# Patient Record
Sex: Female | Born: 1995 | Race: Black or African American | Hispanic: No | Marital: Single | Smoking: Never smoker
Health system: Southern US, Community
[De-identification: ages and names within clinical notes are randomized; demographics above are authoritative.]

## PROBLEM LIST (undated history)

## (undated) ENCOUNTER — Inpatient Hospital Stay (HOSPITAL_COMMUNITY): Payer: Self-pay

## (undated) DIAGNOSIS — L209 Atopic dermatitis, unspecified: Secondary | ICD-10-CM

## (undated) DIAGNOSIS — L709 Acne, unspecified: Secondary | ICD-10-CM

## (undated) DIAGNOSIS — E282 Polycystic ovarian syndrome: Secondary | ICD-10-CM

## (undated) HISTORY — DX: Atopic dermatitis, unspecified: L20.9

## (undated) HISTORY — PX: NO PAST SURGERIES: SHX2092

## (undated) HISTORY — DX: Acne, unspecified: L70.9

---
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
## 2014-02-04 ENCOUNTER — Emergency Department (HOSPITAL_COMMUNITY)
Admission: EM | Admit: 2014-02-04 | Discharge: 2014-02-04 | Disposition: A | Discharge status: Home / Self Care | Payer: 59 | Attending: Emergency Medicine | Admitting: Emergency Medicine

## 2014-02-04 ENCOUNTER — Encounter (HOSPITAL_COMMUNITY): Payer: Self-pay | Admitting: Emergency Medicine

## 2014-02-04 DIAGNOSIS — Z872 Personal history of diseases of the skin and subcutaneous tissue: Secondary | ICD-10-CM | POA: Insufficient documentation

## 2014-02-04 DIAGNOSIS — E739 Lactose intolerance, unspecified: Secondary | ICD-10-CM | POA: Insufficient documentation

## 2014-02-04 DIAGNOSIS — R1084 Generalized abdominal pain: Secondary | ICD-10-CM | POA: Diagnosis present

## 2014-02-04 NOTE — ED Notes (Signed)
Pt states she thinks shes lactose intolerant, ate some cheese dip and ever since then has had abdominal cramping.

## 2014-02-04 NOTE — ED Notes (Signed)
Pt c/o mid abd pain and loose stool starting last night

## 2014-02-04 NOTE — Discharge Instructions (Signed)
Lactose Intolerance, Adult °Lactose intolerance is when the body is not able to digest lactose, a sugar found in milk and milk products. Lactose intolerance is caused by your body not producing enough of the enzyme lactase. When there is not enough lactase to digest the amount of lactose consumed, discomfort may be felt. Lactose intolerance is not a milk allergy. °For most people, lactase deficiency is a condition that develops naturally over time. After about the age of 2, the body begins to produce less lactase. But many people may not experience symptoms until they are much older. °CAUSES °Things that can cause you to be lactose intolerant include: °· Aging. °· Being born without the ability to make lactase. °· Certain digestive diseases. °· Injuries to the small intestine. °SYMPTOMS  °· Feeling sick to your stomach (nauseous). °· Diarrhea. °· Cramps. °· Bloating. °· Gas. °Symptoms usually show up a half hour or 2 hours after eating or drinking products containing lactose. °TREATMENT  °No treatment can improve the body's ability to produce lactase. However, symptoms can be controlled through diet. A medicine may be given to you to take when you consume lactose-containing foods or drinks. The medicine contains the lactase enzyme, which help the body digest lactose better. °HOME CARE INSTRUCTIONS °· Eat or drink dairy products as told by your caregiver or dietician. °· Take all medicine as directed by your caregiver. °· Find lactose-free or lactose-reduced products at your local grocery store. °· Talk to your caregiver or dietician to decide if you need any dietary supplements. °The following is the amount of calcium needed from the diet: °· 19 to 50 years: 1000 mg °· Over 50 years: 1200 mg °Calcium and Lactose in Common Foods °Non-Dairy Products / Calcium Content (mg) °· Calcium-fortified orange juice, 1 cup / 308 to 344 mg °· Sardines, with edible bones, 3 oz / 270 mg °· Salmon, canned, with edible bones, 3 oz /  205 mg °· Soymilk, fortified, 1 cup / 200 mg °· Broccoli (raw), 1 cup / 90 mg °· Orange, 1 medium / 50 mg °· Pinto beans, ½ cup / 40 mg °· Tuna, canned, 3 oz / 10 mg °· Lettuce greens, ½ cup / 10 mg °Dairy Products / Calcium Content (mg) / Lactose Content (g) °· Yogurt, plain, low-fat, 1 cup / 415 mg / 5 g °· Milk, reduced fat, 1 cup / 295 mg / 11 g °· Swiss cheese, 1 oz / 270 mg / 1 g °· Ice cream, ½ cup / 85 mg / 6 g °· Cottage cheese, ½ cup / 75 mg / 2 to 3 g °SEEK MEDICAL CARE IF: °You have no relief from your symptoms. °Document Released: 01/19/2005 Document Revised: 04/13/2011 Document Reviewed: 04/21/2013 °ExitCare® Patient Information ©2015 [employer], LLC. This information is not intended to replace advice given to you by your health care provider. Make sure you discuss any questions you have with your health care provider. ° °

## 2014-02-04 NOTE — ED Provider Notes (Signed)
CSN: [id number]     Arrival date & time 02/04/14  1108 History   First MD Initiated Contact with Patient 02/04/14 1200     Chief Complaint  Patient presents with  . Abdominal Pain     (Consider location/radiation/quality/duration/timing/severity/associated sxs/prior Treatment) Patient is a 19 y.o. female presenting with abdominal pain. The history is provided by the patient.  Abdominal Pain Pain location:  Generalized Pain quality: cramping   Pain severity:  Mild Onset quality:  Sudden Timing:  Intermittent Progression:  Unchanged Chronicity:  Chronic Context: suspicious food intake (when eating dairy products)   Relieved by:  Bowel activity Exacerbated by: dairy products. Associated symptoms: no chest pain, no chills, no cough, no fever and no shortness of breath     Past Medical History  Diagnosis Date  . Acne   . Atopic dermatitis    History reviewed. No pertinent past surgical history. History reviewed. No pertinent family history. History  Substance Use Topics  . Smoking status: Never Smoker   . Smokeless tobacco: Never Used  . Alcohol Use: No   OB History    No data available     Review of Systems  Constitutional: Negative for fever and chills.  Respiratory: Negative for cough and shortness of breath.   Cardiovascular: Negative for chest pain.  Gastrointestinal: Positive for abdominal pain.  All other systems reviewed and are negative.     Allergies  Review of patient's allergies indicates no known allergies.  Home Medications   Prior to Admission medications   Medication Sig Start Date End Date Taking? Authorizing Provider  azithromycin (ZITHROMAX) 500 MG tablet Take 2 tablets (1,000 mg total) by mouth once. Patient not taking: Reported on 02/04/2014 12/13/13   Janit Pagan, MD  fluconazole (DIFLUCAN) 150 MG tablet Take 1 tablet (150 mg total) by mouth once. Patient not taking: Reported on 02/04/2014 12/12/13   Janit Pagan, MD  metroNIDAZOLE  (FLAGYL) 500 MG tablet Take 1 tablet (500 mg total) by mouth 2 (two) times daily. Patient not taking: Reported on 02/04/2014 12/12/13   Janit Pagan, MD   BP 120/84 mmHg  Pulse 81  Temp(Src) 98.4 F (36.9 C) (Oral)  Resp 18  SpO2 100% Physical Exam  Constitutional: She is oriented to person, place, and time. She appears well-developed and well-nourished. No distress.  HENT:  Head: Normocephalic and atraumatic.  Mouth/Throat: Oropharynx is clear and moist.  Eyes: EOM are normal. Pupils are equal, round, and reactive to light.  Neck: Normal range of motion. Neck supple.  Cardiovascular: Normal rate and regular rhythm.  Exam reveals no friction rub.   No murmur heard. Pulmonary/Chest: Effort normal and breath sounds normal. No respiratory distress. She has no wheezes. She has no rales.  Abdominal: Soft. She exhibits no distension. There is no tenderness. There is no rebound.  Musculoskeletal: Normal range of motion. She exhibits no edema.  Neurological: She is alert and oriented to person, place, and time.  Skin: No rash noted. She is not diaphoretic.  Nursing note and vitals reviewed.   ED Course  Procedures (including critical care time) Labs Review Labs Reviewed - No data to display  Imaging Review No results found.   EKG Interpretation None      MDM   Final diagnoses:  Lactose intolerance    19 year old female here with diarrhea. Chronic, states it happens every time she eats dairy products. She said states "I think, lactose intolerant". She states last night after she had a milkshake she had used  the bathroom immediately after drinking it. I agree with her being lactose intolerant. Denies any fevers, vomiting. Instructed to use Lactaid and follow-up with PCP.    Elwin Mocha, MD 02/04/14 1700

## 2014-02-27 ENCOUNTER — Ambulatory Visit: Payer: Self-pay

## 2014-04-11 ENCOUNTER — Ambulatory Visit (INDEPENDENT_AMBULATORY_CARE_PROVIDER_SITE_OTHER): Payer: 59 | Admitting: Family Medicine

## 2014-04-11 ENCOUNTER — Other Ambulatory Visit (HOSPITAL_COMMUNITY)
Admission: RE | Admit: 2014-04-11 | Discharge: 2014-04-11 | Disposition: A | Discharge status: Home / Self Care | Payer: 59 | Source: Ambulatory Visit | Attending: Family Medicine | Admitting: Family Medicine

## 2014-04-11 ENCOUNTER — Encounter: Payer: Self-pay | Admitting: Family Medicine

## 2014-04-11 VITALS — BP 131/89 | HR 89 | Temp 98.6°F | Ht 65.0 in | Wt 207.7 lb

## 2014-04-11 DIAGNOSIS — Z3009 Encounter for other general counseling and advice on contraception: Secondary | ICD-10-CM

## 2014-04-11 DIAGNOSIS — Z309 Encounter for contraceptive management, unspecified: Secondary | ICD-10-CM

## 2014-04-11 DIAGNOSIS — N898 Other specified noninflammatory disorders of vagina: Secondary | ICD-10-CM

## 2014-04-11 DIAGNOSIS — Z308 Encounter for other contraceptive management: Secondary | ICD-10-CM

## 2014-04-11 DIAGNOSIS — Z113 Encounter for screening for infections with a predominantly sexual mode of transmission: Secondary | ICD-10-CM | POA: Diagnosis present

## 2014-04-11 DIAGNOSIS — N926 Irregular menstruation, unspecified: Secondary | ICD-10-CM | POA: Insufficient documentation

## 2014-04-11 LAB — POCT WET PREP (WET MOUNT): Clue Cells Wet Prep Whiff POC: NEGATIVE

## 2014-04-11 LAB — POCT URINE PREGNANCY: Preg Test, Ur: NEGATIVE

## 2014-04-11 MED ORDER — MEDROXYPROGESTERONE ACETATE 150 MG/ML IM SUSP
150.0000 mg | Freq: Once | INTRAMUSCULAR | Status: AC
  Administered 2014-04-11: 150 mg via INTRAMUSCULAR

## 2014-04-11 MED ORDER — METRONIDAZOLE 500 MG PO TABS: 500.0000 mg | ORAL_TABLET | Freq: Three times a day (TID) | ORAL | Status: DC

## 2014-04-11 NOTE — Progress Notes (Signed)
   Subjective:    Patient ID: Kayla Mckay, female    DOB: [birth year], 19 y.o.   MRN: [id number]  HPI  Irregular menstrual cycles: Patient presents with a history of varicella menstrual cycles after missing her Depo-Provera shot. She states she went without her Depo-Provera for a few months, and had a shot in November. She noticed at the end of January she was spotting occasionally. She missed the timing of her shot in February. She then noticed after intercourse last week that she started bleeding. She denies unprotected sex over the last few months. She is interested in a different form of birth control if possible, secondary to almost a 20 pound weight gain which she feels is contributed to her Depo-Provera shot..  Vaginal discharge: Patient was seen in November, and diagnosed with bacterial vaginitis, she states she only took 2 days of her Flagyl. She was also chlamydia positive and treated at that time. She reports that she has had gonorrhea in the past. She doesn't understand why she continues to have repeat infections, and is concerned that they are not getting cured. She has a different partner currently than she did in November. She reports that her partner told her that he was treated as well. States she took all of her antibiotics that was prescribed as prescribed except for the Flagyl for her BV. She has noticed an increase in discharge over the last week, with return of a foul odor. States she has been sexually active, but has not had unprotected sex. She denies fever, abdominal pain, vaginal itching or dysuria.  Nonsmoker Past Medical History  Diagnosis Date  . Acne   . Atopic dermatitis    No Known Allergies   Review of Systems Per hPI    Objective:   Physical Exam BP 131/89 mmHg  Pulse 89  Temp(Src) 98.6 F (37 C) (Oral)  Ht 5\' 5"  (1.651 m)  Wt 207 lb 11.2 oz (94.212 kg)  BMI 34.56 kg/m2 Gen: NAD. Well developed, well nourished, obese. Nontoxic in appearance. Abd:  Soft. Round. NTND. BS present. No Masses palpated.  GYN:  External genitalia within normal limits, mild erythema.  Vaginal mucosa pink, moist, normal rugae.  Nonfriable cervix without lesions, minimal yellow discharge, no bleeding noted on speculum exam.   No cervical motion tenderness. No adnexal masses bilaterally.        Assessment & Plan:

## 2014-04-11 NOTE — Assessment & Plan Note (Signed)
Patient was counseled on birth control options for her today. She decided to go ahead and get the Depo-Provera shot today after negative pregnancy test. Over the pamphlet that was sent home with her, and make a decision before her next Depo-Provera shot is due. He was encouraged to call in ahead of time if she decided she wanted an implantable device an IUD to see if there is a charge with her insurance and also in order to have us get one in her under her name.

## 2014-04-11 NOTE — Patient Instructions (Signed)
Bacterial Vaginosis Bacterial vaginosis is a vaginal infection that occurs when the normal balance of bacteria in the vagina is disrupted. It results from an overgrowth of certain bacteria. This is the most common vaginal infection in women of childbearing age. Treatment is important to prevent complications, especially in pregnant women, as it can cause a premature delivery. CAUSES  Bacterial vaginosis is caused by an increase in harmful bacteria that are normally present in smaller amounts in the vagina. Several different kinds of bacteria can cause bacterial vaginosis. However, the reason that the condition develops is not fully understood. RISK FACTORS Certain activities or behaviors can put you at an increased risk of developing bacterial vaginosis, including:  Having a new sex partner or multiple sex partners.  Douching.  Using an intrauterine device (IUD) for contraception. Women do not get bacterial vaginosis from toilet seats, bedding, swimming pools, or contact with objects around them. SIGNS AND SYMPTOMS  Some women with bacterial vaginosis have no signs or symptoms. Common symptoms include:  Grey vaginal discharge.  A fishlike odor with discharge, especially after sexual intercourse.  Itching or burning of the vagina and vulva.  Burning or pain with urination. DIAGNOSIS  Your health care provider will take a medical history and examine the vagina for signs of bacterial vaginosis. A sample of vaginal fluid may be taken. Your health care provider will look at this sample under a microscope to check for bacteria and abnormal cells. A vaginal pH test may also be done.  TREATMENT  Bacterial vaginosis may be treated with antibiotic medicines. These may be given in the form of a pill or a vaginal cream. A second round of antibiotics may be prescribed if the condition comes back after treatment.  HOME CARE INSTRUCTIONS   Only take over-the-counter or prescription medicines as  directed by your health care provider.  If antibiotic medicine was prescribed, take it as directed. Make sure you finish it even if you start to feel better.  Do not have sex until treatment is completed.  Tell all sexual partners that you have a vaginal infection. They should see their health care provider and be treated if they have problems, such as a mild rash or itching.  Practice safe sex by using condoms and only having one sex partner. SEEK MEDICAL CARE IF:   Your symptoms are not improving after 3 days of treatment.  You have increased discharge or pain.  You have a fever. MAKE SURE YOU:   Understand these instructions.  Will watch your condition.  Will get help right away if you are not doing well or get worse. FOR MORE INFORMATION  Centers for Disease Control and Prevention, Division of STD Prevention: [employer] American Sexual Health Association (ASHA): www.ashastd.org  Document Released: 01/19/2005 Document Revised: 11/09/2012 Document Reviewed: 08/31/2012 [facility] Patient Information 2015 [place], [place]. This information is not intended to replace advice given to you by your health care provider. Make sure you discuss any questions you have with your health care provider.  You still have a bacterial vaginosis infection, please take your Flagyl as prescribed, I have be taking it 3 times a day, you can take this with meals. I will call you once her other cultures become available. Please look over the birth control options pamphlet that was given to you today, in make a decision about what you're interested in using. Extremities you call and had a time at least a few weeks to let us know what you decided so that we  may order one for you if it is an implantable device or the IUD. May be some charged you out-of-pocket, and insurance may not cover all of it so make sure that you speak to us prior so we can let you know if there is a charge.

## 2014-04-11 NOTE — Assessment & Plan Note (Signed)
Likely secondary to timing of Depo-Provera. Patient's pregnancy test today was negative. Depo-Provera shot was given today. Follow-up as needed

## 2014-04-11 NOTE — Assessment & Plan Note (Addendum)
Patient positive for clue cells on wet prep today. Treated with Flagyl increase dose to 3 times a day. Cultures for gonorrhea and chlamydia. She'll be called with results once they become available. And treated appropriately if necessary. She was counseled on safe sex practices.

## 2014-04-12 ENCOUNTER — Telehealth: Payer: Self-pay | Admitting: Family Medicine

## 2014-04-12 LAB — CERVICOVAGINAL ANCILLARY ONLY
Chlamydia: NEGATIVE
Neisseria Gonorrhea: NEGATIVE

## 2014-04-12 NOTE — Telephone Encounter (Signed)
LVM for patient to call back to inform her of below results. 

## 2014-04-12 NOTE — Telephone Encounter (Signed)
Please call pt, her gonorrhea and Chlamydia test are negative. Thanks.

## 2014-04-13 NOTE — Telephone Encounter (Signed)
Pt called back and I informed her of the below message referencing her G&C results. Kayla Mckay, [name] D

## 2014-05-24 ENCOUNTER — Encounter: Payer: Self-pay | Admitting: Family Medicine

## 2014-05-24 ENCOUNTER — Ambulatory Visit (INDEPENDENT_AMBULATORY_CARE_PROVIDER_SITE_OTHER): Payer: 59 | Admitting: Family Medicine

## 2014-05-24 VITALS — BP 128/82 | HR 99 | Temp 98.3°F | Ht 65.0 in | Wt 206.3 lb

## 2014-05-24 DIAGNOSIS — R319 Hematuria, unspecified: Secondary | ICD-10-CM

## 2014-05-24 DIAGNOSIS — N3001 Acute cystitis with hematuria: Secondary | ICD-10-CM | POA: Diagnosis not present

## 2014-05-24 LAB — POCT URINALYSIS DIPSTICK
Bilirubin, UA: NEGATIVE
Glucose, UA: NEGATIVE
Ketones, UA: NEGATIVE
Nitrite, UA: NEGATIVE
PROTEIN UA: 100
Spec Grav, UA: 1.02
UROBILINOGEN UA: 0.2
pH, UA: 6

## 2014-05-24 LAB — POCT UA - MICROSCOPIC ONLY: RBC, urine, microscopic: >20

## 2014-05-24 MED ORDER — CEPHALEXIN 500 MG PO CAPS: 500.0000 mg | ORAL_CAPSULE | Freq: Three times a day (TID) | ORAL | Status: DC

## 2014-05-24 NOTE — Patient Instructions (Signed)
Thank you for coming in, today!  I do think you have a UTI. I want you to take Keflex (cephalexin) 500 mg three times a day for 5 days. We will check a urine culture. If anything grows that looks like the Keflex won't cover it, or if we need to change your antibiotics, we'll call you. Otherwise, come back to see Dr. Doroteo GlassmanPhelps as you need.  Please feel free to call with any questions or concerns at any time, at [phone]. --Dr. Casper HarrisonStreet

## 2014-05-24 NOTE — Progress Notes (Signed)
   Subjective:    Patient ID: Kayla Mckay, female    DOB: [birth year], 19 y.o.   MRN: [id number]  HPI: Pt presents to clinic for SDA visit for blood in her urine and a "crampy" feeling in her bladder when she urinates. She noted blood in her urine about 4 days ago, a tinge of blood on the tissue when she wipes. She does feel like she has incomplete emptying and increased frequency / urge, with some dribbling. She has no vaginal discharge, itching, or burning. Denies fevers, chills, N/V, abdominal pain, change in her bowel habits.  Of note pt was recently treated for BV with Flagyl and feels like these symptoms started after taking that. She has had UTI's in the past (last was about 6 months ago), and her current symptoms are very similar to that. She is on Depo-Provera and has no regular periods but has no obvious vaginal bleeding / spotting, currently.  Review of Systems: As above.      Objective:   Physical Exam BP 128/82 mmHg  Pulse 99  Temp(Src) 98.3 F (36.8 C) (Oral)  Ht 5\' 5"  (1.651 m)  Wt 206 lb 4.8 oz (93.577 kg)  BMI 34.33 kg/m2 Gen: non-toxic-appearing young adult female in NAD HEENT: [place]/AT, EOMI, PERRLA, MMM Cardio: RRR, no murmur appreciated Pulm: CTAB, no wheezes, normal WOB Abd: soft, BS+, mild suprapubic tenderness with deep palpation Ext: warm, well-perfused, no LE edema  UA: cloudy, 100 protein, large leukocytes, large Hb Urine micro: loaded WBC, >20 RBC pHPF, 2+ bacteria, few epithelial cells     Assessment & Plan:  19yo with clinical symptoms of acute cystitis with consistent exam and UA / urine microscopy consistent with UTI with hematuria - Rx for Keflex 500 mg TID for 5 days - urine culture collected; will f/u result as needed and change abx if warranted - recommended good PO hydration, OTC meds for pain as needed - strongly recommended close f/u if any frank worsening, fever, inability to tolerate PO, etc - f/u with PCP Dr. Doroteo GlassmanPhelps otherwise, as  needed  Note FYI to Dr. Doroteo GlassmanPhelps.  Bobbye Mortonhristopher M Maeola Mchaney, MD PGY-3, [facility] 05/24/2014, 12:31 PM

## 2014-05-26 LAB — URINE CULTURE: Colony Count: >=100000

## 2014-05-31 ENCOUNTER — Encounter (HOSPITAL_COMMUNITY): Payer: Self-pay

## 2014-05-31 ENCOUNTER — Emergency Department (HOSPITAL_COMMUNITY)
Admission: EM | Admit: 2014-05-31 | Discharge: 2014-05-31 | Disposition: A | Discharge status: Home / Self Care | Payer: 59 | Attending: Emergency Medicine | Admitting: Emergency Medicine

## 2014-05-31 DIAGNOSIS — N390 Urinary tract infection, site not specified: Secondary | ICD-10-CM | POA: Insufficient documentation

## 2014-05-31 DIAGNOSIS — R197 Diarrhea, unspecified: Secondary | ICD-10-CM | POA: Insufficient documentation

## 2014-05-31 DIAGNOSIS — Z872 Personal history of diseases of the skin and subcutaneous tissue: Secondary | ICD-10-CM | POA: Insufficient documentation

## 2014-05-31 DIAGNOSIS — Z3202 Encounter for pregnancy test, result negative: Secondary | ICD-10-CM | POA: Diagnosis not present

## 2014-05-31 LAB — CBC WITH DIFFERENTIAL/PLATELET
BASOS PCT: 0 % (ref 0–1)
Basophils Absolute: 0 10*3/uL (ref 0.0–0.1)
EOS ABS: 0.2 10*3/uL (ref 0.0–0.7)
Eosinophils Relative: 2 % (ref 0–5)
HCT: 40.2 % (ref 36.0–46.0)
HEMOGLOBIN: 12.6 g/dL (ref 12.0–15.0)
LYMPHS ABS: 2.4 10*3/uL (ref 0.7–4.0)
Lymphocytes Relative: 27 % (ref 12–46)
MCH: 26.4 pg (ref 26.0–34.0)
MCHC: 31.3 g/dL (ref 30.0–36.0)
MCV: 84.1 fL (ref 78.0–100.0)
MONOS PCT: 5 % (ref 3–12)
Monocytes Absolute: 0.5 10*3/uL (ref 0.1–1.0)
NEUTROS ABS: 6 10*3/uL (ref 1.7–7.7)
NEUTROS PCT: 66 % (ref 43–77)
PLATELETS: 247 10*3/uL (ref 150–400)
RBC: 4.78 MIL/uL (ref 3.87–5.11)
RDW: 13.7 % (ref 11.5–15.5)
WBC: 9.1 10*3/uL (ref 4.0–10.5)

## 2014-05-31 LAB — COMPREHENSIVE METABOLIC PANEL
ALK PHOS: 70 U/L (ref 39–117)
ALT: 15 U/L (ref 0–35)
AST: 26 U/L (ref 0–37)
Albumin: 3.9 g/dL (ref 3.5–5.2)
Anion gap: 7 (ref 5–15)
BUN: 7 mg/dL (ref 6–23)
CHLORIDE: 108 mmol/L (ref 96–112)
CO2: 25 mmol/L (ref 19–32)
Calcium: 9 mg/dL (ref 8.4–10.5)
Creatinine, Ser: 0.87 mg/dL (ref 0.50–1.10)
GFR calc Af Amer: >90 mL/min (ref >90)
GFR calc non Af Amer: >90 mL/min (ref >90)
Glucose, Bld: 93 mg/dL (ref 70–99)
Potassium: 3.3 mmol/L — ABNORMAL LOW (ref 3.5–5.1)
SODIUM: 140 mmol/L (ref 135–145)
Total Bilirubin: 0.8 mg/dL (ref 0.3–1.2)
Total Protein: 7.7 g/dL (ref 6.0–8.3)

## 2014-05-31 LAB — URINALYSIS, ROUTINE W REFLEX MICROSCOPIC
Bilirubin Urine: NEGATIVE
Glucose, UA: NEGATIVE mg/dL
Ketones, ur: NEGATIVE mg/dL
Nitrite: NEGATIVE
Protein, ur: NEGATIVE mg/dL
Specific Gravity, Urine: 1.014 (ref 1.005–1.030)
Urobilinogen, UA: 0.2 mg/dL (ref 0.0–1.0)
pH: 6 (ref 5.0–8.0)

## 2014-05-31 LAB — URINE MICROSCOPIC-ADD ON

## 2014-05-31 LAB — PREGNANCY, URINE: Preg Test, Ur: NEGATIVE

## 2014-05-31 MED ORDER — ACIDOPHILUS PROBIOTIC 10 MG PO TABS: 10.0000 mg | ORAL_TABLET | Freq: Two times a day (BID) | ORAL | Status: DC

## 2014-05-31 NOTE — ED Provider Notes (Signed)
CSN: [id number]     Arrival date & time 05/31/14  1719 History   First MD Initiated Contact with Patient 05/31/14 1827     Chief Complaint  Patient presents with  . Emesis  . Diarrhea     (Consider location/radiation/quality/duration/timing/severity/associated sxs/prior Treatment) HPI Comments: Patient was diagnosed with the urine tract infection approximately 1 week ago and was given a prescription for Keflex. She did not fill the prescription until 3 days ago and took the first dose yesterday. She has had dysuria, frequency and suprapubic tenderness. She denies any fever but after starting the antibiotic yesterday she had 2 episodes of mucousy stool and has been nauseated today. She denies any blood in the stool and no vomiting. No flank tenderness. She did take a course of Flagyl 2 weeks ago for bacterial vaginosis but denies any symptoms after taking that.  Patient is a 19 y.o. female presenting with diarrhea. The history is provided by the patient.  Diarrhea Quality:  Mucous Severity:  Mild Onset quality:  Sudden Number of episodes:  2 Duration:  1 day Timing:  Sporadic Progression:  Unchanged Relieved by:  None tried Worsened by:  Nothing tried Ineffective treatments:  None tried Associated symptoms: no chills, no recent cough, no fever and no vomiting     Past Medical History  Diagnosis Date  . Acne   . Atopic dermatitis    History reviewed. No pertinent past surgical history. History reviewed. No pertinent family history. History  Substance Use Topics  . Smoking status: Never Smoker   . Smokeless tobacco: Never Used  . Alcohol Use: No   OB History    No data available     Review of Systems  Constitutional: Negative for fever and chills.  Gastrointestinal: Positive for diarrhea. Negative for vomiting.  All other systems reviewed and are negative.     Allergies  Review of patient's allergies indicates no known allergies.  Home Medications   Prior to  Admission medications   Medication Sig Start Date End Date Taking? Authorizing Provider  cephALEXin (KEFLEX) 500 MG capsule Take 1 capsule (500 mg total) by mouth 3 (three) times daily. 05/24/14   Stephanie Couphristopher M Street, MD   BP 130/84 mmHg  Pulse 76  Temp(Src) 98.1 F (36.7 C) (Oral)  Resp 18  SpO2 100% Physical Exam  Constitutional: She is oriented to person, place, and time. She appears well-developed and well-nourished. No distress.  HENT:  Head: Normocephalic and atraumatic.  Mouth/Throat: Oropharynx is clear and moist.  Eyes: Conjunctivae and EOM are normal. Pupils are equal, round, and reactive to light.  Neck: Normal range of motion. Neck supple.  Cardiovascular: Normal rate, regular rhythm and intact distal pulses.   No murmur heard. Pulmonary/Chest: Effort normal and breath sounds normal. No respiratory distress. She has no wheezes. She has no rales.  Abdominal: Soft. She exhibits no distension. There is tenderness in the suprapubic area. There is no rebound and no guarding.  Musculoskeletal: Normal range of motion. She exhibits no edema or tenderness.  Neurological: She is alert and oriented to person, place, and time.  Skin: Skin is warm and dry. No rash noted. No erythema.  Psychiatric: She has a normal mood and affect. Her behavior is normal.  Nursing note and vitals reviewed.   ED Course  Procedures (including critical care time) Labs Review Labs Reviewed  COMPREHENSIVE METABOLIC PANEL - Abnormal; Notable for the following:    Potassium 3.3 (*)    All other components within normal  limits  URINALYSIS, ROUTINE W REFLEX MICROSCOPIC - Abnormal; Notable for the following:    APPearance CLOUDY (*)    Hgb urine dipstick SMALL (*)    Leukocytes, UA LARGE (*)    All other components within normal limits  URINE MICROSCOPIC-ADD ON - Abnormal; Notable for the following:    Bacteria, UA MANY (*)    All other components within normal limits  CBC WITH DIFFERENTIAL/PLATELET   PREGNANCY, URINE    Imaging Review No results found.   EKG Interpretation None      MDM   Final diagnoses:  UTI (lower urinary tract infection)  Diarrhea    Patient presents today due to 2 episodes of mucousy diarrhea that occurred after she started Keflex yesterday. She has had nausea without vomiting today and denies any back pain. She was diagnosed with the UTI a proximal line 1-2 weeks ago but did not have the antibiotic filled until 3 days ago and did not start it until yesterday. Patient states she often gets diarrhea when taking antibiotics but never noticed mucus before. She denies any travel, only other antibiotic she's been on his Flagyl in the last 2 weeks. She is complaining of bladder spasm and suprapubic tenderness. Low suspicion for pyelonephritis at this time. Low suspicion for C. difficile at this time. Feel most likely the diarrhea is just result of the antibiotic and recommended starting a probiotic and yogurt.  7:18 PM UA consistent with UTI. Patient given probiotic and encouraged to take yogurt  Gwyneth Sprout, MD 05/31/14 1919

## 2014-05-31 NOTE — ED Notes (Signed)
Pt has been on antibiotic x 1 week for bladder infection. Last night she started additional antibiotic.  Pt states she has had n/v/d since this time.  No fever.  Slight abdominal pain with symptoms.

## 2015-01-03 ENCOUNTER — Ambulatory Visit: Payer: 59 | Admitting: Family Medicine

## 2015-05-22 LAB — HEMOGLOBIN A1C: Hemoglobin A1C: 5.7

## 2015-05-22 LAB — TSH: TSH: 0.54 u[IU]/mL (ref 0.41–5.90)

## 2015-06-25 ENCOUNTER — Encounter: Payer: Self-pay | Admitting: *Deleted

## 2015-07-02 ENCOUNTER — Encounter: Payer: Self-pay | Admitting: Internal Medicine

## 2015-07-02 ENCOUNTER — Ambulatory Visit (INDEPENDENT_AMBULATORY_CARE_PROVIDER_SITE_OTHER): Payer: 59 | Admitting: Internal Medicine

## 2015-07-02 VITALS — BP 108/64 | HR 64 | Temp 97.9°F | Resp 12 | Ht 64.0 in | Wt 219.8 lb

## 2015-07-02 DIAGNOSIS — E8881 Metabolic syndrome: Secondary | ICD-10-CM

## 2015-07-02 DIAGNOSIS — E282 Polycystic ovarian syndrome: Secondary | ICD-10-CM

## 2015-07-02 LAB — VITAMIN D 25 HYDROXY (VIT D DEFICIENCY, FRACTURES): VITD: 11.57 ng/mL — AB (ref 30.00–100.00)

## 2015-07-02 NOTE — Patient Instructions (Signed)
Please stop at the lab.  Patient information (Up-to-Date): Collection of a 24-hour urine specimen  - You should collect every drop of urine during each 24-hour period. It does not matter how much or little urine is passed each time, as long as every drop is collected. - Begin the urine collection in the morning after you wake up, after you have emptied your bladder for the first time. - Urinate (empty the bladder) for the first time and flush it down the toilet. Note the exact time (eg, 6:15 AM). You will begin the urine collection at this time. - Collect every drop of urine during the day and night in an empty collection bottle. Store the bottle at room temperature or in the refrigerator. - If you need to have a bowel movement, any urine passed with the bowel movement should be collected. Try not to include feces with the urine collection. If feces does get mixed in, do not try to remove the feces from the urine collection bottle. - Finish by collecting the first urine passed the next morning, adding it to the collection bottle. This should be within ten minutes before or after the time of the first morning void on the first day (which was flushed). In this example, you would try to void between 6:05 and 6:25 on the second day. - If you need to urinate one hour before the final collection time, drink a full glass of water so that you can void again at the appropriate time. If you have to urinate 20 minutes before, try to hold the urine until the proper time. - Please note the exact time of the final collection, even if it is not the same time as when collection began on day 1. - The bottle(s) may be kept at room temperature for a day or two, but should be kept cool or refrigerated for longer periods of time.   Please start Metformin 500 mg with dinner x 4 days. If you tolerate this well, add another Metformin tablet (500 mg) with breakfast x 4 days. If you tolerate this well, add another metformin  tablet with dinner (total 1000 mg) x 4 days. If you tolerate this well, add another metformin tablet with breakfast (total 1000 mg). Continue with 1000 mg of metformin 2x a day with breakfast and dinner.  Start the oral contraceptives as per ObGyn advise.  Please schedule an appt with Denny Levy with nutrition.  Look up the [email or web address]  Plant-based diet materials:  [employer]  [employer].  Forks over [employer]  Please consider the following ways to cut down carbs and fat and increase fiber and micronutrients in your diet: - substitute whole grain for white bread or pasta - substitute brown rice for white rice - substitute 90-calorie flat bread pieces for slices of bread when possible - substitute sweet potatoes or yams for white potatoes - substitute humus for margarine - substitute tofu for cheese when possible - substitute almond or rice milk for regular milk (would not drink soy milk daily due to concern for soy estrogen influence on breast cancer risk) - substitute dark chocolate for other sweets when possible - substitute water - can add lemon or orange slices for taste - for diet sodas (artificial sweeteners will trick your body that you can eat sweets without getting calories and will lead you to overeating and weight gain in the long run) - do not skip breakfast or other meals (this will slow down the metabolism and will  result in more weight gain over time)  - can try smoothies made from fruit and almond/rice milk in am instead of regular breakfast - can also try old-fashioned (not instant) oatmeal made with almond/rice milk in am - order the dressing on the side when eating salad at a restaurant (pour less than half of the dressing on the salad) - eat as little meat as possible - can try juicing, but should not forget that juicing will get rid of the fiber, so would alternate with eating raw veg./fruits or drinking smoothies - use as little oil as possible, even when  using olive oil - can dress a salad with a mix of balsamic vinegar and lemon juice, for e.g. - use agave nectar, stevia sugar, or regular sugar rather than artificial sweateners - steam or broil/roast veggies  - snack on veggies/fruit/nuts (unsalted, preferably) when possible, rather than processed foods - reduce or eliminate aspartame in diet (it is in diet sodas, chewing gum, etc) Read the labels!  Try to read Dr. Katherina RightNeal Barnard's book: "Program for Reversing Diabetes" for other ideas for healthy eating.

## 2015-07-02 NOTE — Progress Notes (Signed)
Patient ID: Kayla Mckay, female   DOB: [birth year], 20 y.o.   MRN: [id number]   HPI: Kayla PernaDasia M Grauberger is a 20 y.o. female, referred by Dory HornElisabeth Newsome, NP, for evaluation for amenorrhea, increased insulin level and HbA1c, weight gain.she is here with her mom, who offers part of the history.  Weight gain: - 206 lbs in 05/2014, 207 lbs in 01/2015 >> 219 lbs  today - no steroid use - no weight loss meds - Meals: - Breakfast: pancakes or nothing - Lunch: fast food (works in [employer]) - Dinner: varies  - sometimes she does not eat  - Snacks: 2-3 Drinks: regular sodas - Diets tried: cuts portions - Exercise: no  Fertility/Menstrual cycles: - irregular menses >> amenorrhea - since 2012; As of now, she did a Provera challenge, with 10 mg for 10 days >> did not work - + only 1 ovarian cyst per U/S - children: 0 - miscarriages: 0 - contraception: She has a history of Depo-Provera use for 5 years - would bleed the last month before the next Provera inj, with the last injection 12/2013. No periods or spotting since stopping the Depo-Provera. She is interested in the Mirena IUD, to start after the endocrine evaluation is complete.   Acne: - no - + eczema in warm months >> saw dermatologist >> dx'ed with skin yeast inf >> treated in the past   Hirsutism: - no - + dandruff - just started  Other meds: - SSRIs: no  Reviewed pertinent labs - per records from OB/GYN: 05/16/2015: - Estradiol 44, FSH 6.4, LH 4.9, progesterone 0.5 - Prolactin 10.2 - AMH 4.33 - B HCG <2.0 - Total testosterone LC/MS/MS: 24 (2-45), free testosterone 7.0 (0.1-6.4), SHBG 9 (17-124) - DHEAS 293 (51-321) - Total insulin 131.5 (2-19.6)  - Last thyroid tests: Lab Results  Component Value Date   TSH 0.54 05/22/2015   - Last set of lipids: No results found for: CHOL, TRIG, HDL, CHOLHDL, VLDL, LDLCALC  - Last HbA1c: Lab Results  Component Value Date   HGBA1C 5.7 05/22/2015   She has FH of DM in PGF,  M aunts.  ROS: Constitutional: + weight gain, + decreased appetite, no fatigue, no subjective hyperthermia/hypothermia, + poor sleep Eyes: no blurry vision, no xerophthalmia ENT: no sore throat, no nodules palpated in throat, no dysphagia/odynophagia, no hoarseness Cardiovascular: no CP/SOB/palpitations/+ leg swelling (she stands 10-12 hours a day) Respiratory: no cough/SOB Gastrointestinal: no N/V/D/C Musculoskeletal: no muscle/joint aches Skin: + minimum hair on face,+ easy bruising, + new stretch marks Neurological: no tremors/numbness/tingling/dizziness Psychiatric: + depression/no anxiety  Past Medical History  Diagnosis Date  . Acne   . Atopic dermatitis    No past surgical history on file. Social History   Social History  . Marital Status: Single    Spouse Name: N/A  . Number of Children: 0  . Years of Education: general st   Occupational History  . [employer]     Social History Main Topics  . Smoking status: Never Smoker   . Smokeless tobacco: Never Used  . Alcohol Use: No  . Drug Use: No  . Sexual Activity: Yes    Birth Control/ Protection: Condom   Social History Narrative   Living at home with mom and two siblings.   Devin and Destiny.   Going to college to study bio/nursing.   Goal of becoming a PA.    Single   Patient is not taking any medicines.  No Known Allergies   Family  history: - Diabetes in paternal grandfather and maternal aunts  PE: BP 108/64 mmHg  Pulse 64  Temp(Src) 97.9 F (36.6 C) (Oral)  Resp 12  Ht  (1.626 m)  Wt 219 lb 12.8 oz (99.701 kg)  BMI 37.71 kg/m2  SpO2 99% Wt Readings from Last 3 Encounters:  07/02/15 219 lb 12.8 oz (99.701 kg)  05/24/14 206 lb 4.8 oz (93.577 kg) (98 %*, Z = 2.03)  04/11/14 207 lb 11.2 oz (94.212 kg) (98 %*, Z = 2.05)   * Growth percentiles are based on CDC 2-20 Years data.   Constitutional: obese, in NAD, no full supraclavicular fat pads Eyes: PERRLA, EOMI, no exophthalmos ENT: moist  mucous membranes, no thyromegaly, no cervical lymphadenopathy Cardiovascular: RRR, No MRG Respiratory: CTA B Gastrointestinal: abdomen soft, NT, ND, BS+ Musculoskeletal: no deformities, strength intact in all 4 Skin: moist, warm; + small acne scars on face, no dark terminal hair on chin, + vellum on sideburns, no skin tags, + acanthosis nigricans on neck, chest, knuckles Neurological: no tremor with outstretched hands, DTR normal in all 4  ASSESSMENT: 1. Mild PCOS  2. Insulin resistance  PLAN: 1. Mild PCOS - based on the labs performed by OB/GYN (which were reviewed along with patient and her mother), she has mildly elevated free, testosterone, with a very low SHBG and a normal total testosterone level. Her LH is not higher than her FSH. In this case, labs are not pointing towards clear PCOS, and I believe that her slightly higher free testosterone level is caused by her very low SHBG, which in turn, is caused by her very poor diet. At best, she possibly has mild PCOS that can contribute to her amenorrhea. She does not have clear acne and hirsutism, which again point away from a clear PCOS diagnosis. However, she was told that her amenorrhea was due to lack of ovulation, which is confirmed by a very low progesterone, however, I am not sure about the timing when the lab was obtained. Her estrogen level was normal. Her prolactin, DHEAS and thyroid tests were normal, ruling out prolactinoma, adrenal diseases and hypo-/hyperthyroidism. One other thing that we need to rule out is Cushing's disease (and I would like to obtain a 24-hour urine for cortisol level to check for this, although my suspicion is low) and nonclassic CAH ( I will also check a 17 hydroxyprogesterone, although my suspicion is again low). - I had a long discussion with the patient and her mother about the fact that she possibly has mild PCOS, which is a sum of several conditions, including:  weight gain  insulin resistance (and  therefore a higher risk of developing diabetes later in life)  acne  hirsutism  irregular menstrual cycles  decreased fertility. - the treatment is usually targeted to addressing the problem that concerns the patient the most: acne/hirsutism, weight gain, or fertility, but there is no single treatment for PCOS.  - The first-line therapy are oral contraceptives, especially for her, since she does not have menstrual cycles. We did discuss that her mild PCOS should not cause complete amenorrhea, however, I suspect that her high insulin resistance can do that please also see top it below). I encouraged her to start oral contraceptive as suggested by OB/GYN and we discussed about endometrial hyperplasia and the fact that it is absolutely necessary for her to have at least 4 menstrual cycles a year to preserve her uterus and her bones. - I also suggested that she started metformin, since  her weight is a significant problem for her >> this will help reduce her insulin resistance and may also help with her menstrual cycles. She agrees to start this. - I gave her references about the website [email or web address].[email or web address] and I referred her to nutrition specifically addressing her PCOS and her insulin resistance  Orders Placed This Encounter  Procedures  . Creatinine, urine, 24 hour  . 17-Hydroxyprogesterone  . Cortisol, urine, 24 hour  . VITAMIN D 25 Hydroxy (Vit-D Deficiency, Fractures)  . Androstenedione  . Amb ref to Medical Nutrition Therapy-MNT  Will also need a Lipid level at next visit.  2. Insulin resistance - I believe this is the patient's most significant problem. She has an incredibly poor diet, almost exclusively relying on fast food and regular sodas. We spent a significant amount of the appointment discussing with the patient and her mother about the deleterious effect of her diet on her general health, weight, menstrual cycles, and energy.  - We discussed about her very high insulin levels and also  her very low SHBG, pointing towards very high insulin resistance. I explained that I'm not surprised that she is gaining weight on this diet, although, she mentions that she is not eating a lot. I explained that the quality and not only the quantity of the food wheat will greatly impact our health and overweight. I referred her to nutrition and also given her references about healthier diet. I strongly recommended that she starts implementing changes in her diet, while acknowledging that this could be very difficult, and it may take time. - I believe the metformin should also help with her insulin resistance and her weight.  - time spent with the patient: 1 hour, of which >50% was spent in obtaining information about her symptoms, reviewing her previous labs, evaluations, and treatments, counseling her about her conditions and about healthy nutrition (please see the discussed topics above), and developing a plan to further investigate and treat her PCOS and insulin resistance.   Component     Latest Ref Rng 07/02/2015  17-OH-Progesterone, LC/MS/MS      ng/dL 26 (normal)  VITD     16.10 - 100.00 ng/mL 11.57 (L)  Androstenedione      144  24h urine cortisol pending.  Normal androstenedione and 17 hydroxyprogesterone. She does have  vitamin D  deficiency, for which I will advise her to start 5000 units of vitamin D 3 daily.

## 2015-07-05 LAB — 17-HYDROXYPROGESTERONE: 17-OH-Progesterone, LC/MS/MS: 26 ng/dL

## 2015-07-06 LAB — ANDROSTENEDIONE: Androstenedione: 144 ng/dL

## 2015-07-08 DIAGNOSIS — E8881 Metabolic syndrome: Secondary | ICD-10-CM | POA: Insufficient documentation

## 2015-07-08 DIAGNOSIS — E282 Polycystic ovarian syndrome: Secondary | ICD-10-CM | POA: Insufficient documentation

## 2015-09-16 ENCOUNTER — Encounter (HOSPITAL_COMMUNITY): Payer: Self-pay | Admitting: *Deleted

## 2015-09-16 ENCOUNTER — Inpatient Hospital Stay (HOSPITAL_COMMUNITY)
Admission: AD | Admit: 2015-09-16 | Discharge: 2015-09-16 | Disposition: A | Discharge status: Home / Self Care | Payer: 59 | Source: Ambulatory Visit | Attending: Obstetrics and Gynecology | Admitting: Obstetrics and Gynecology

## 2015-09-16 DIAGNOSIS — N760 Acute vaginitis: Secondary | ICD-10-CM | POA: Insufficient documentation

## 2015-09-16 DIAGNOSIS — N898 Other specified noninflammatory disorders of vagina: Secondary | ICD-10-CM | POA: Diagnosis present

## 2015-09-16 DIAGNOSIS — E282 Polycystic ovarian syndrome: Secondary | ICD-10-CM | POA: Diagnosis not present

## 2015-09-16 DIAGNOSIS — B373 Candidiasis of vulva and vagina: Secondary | ICD-10-CM | POA: Diagnosis not present

## 2015-09-16 DIAGNOSIS — B9689 Other specified bacterial agents as the cause of diseases classified elsewhere: Secondary | ICD-10-CM | POA: Diagnosis not present

## 2015-09-16 DIAGNOSIS — B3731 Acute candidiasis of vulva and vagina: Secondary | ICD-10-CM

## 2015-09-16 DIAGNOSIS — A499 Bacterial infection, unspecified: Secondary | ICD-10-CM | POA: Diagnosis not present

## 2015-09-16 HISTORY — DX: Polycystic ovarian syndrome: E28.2

## 2015-09-16 LAB — URINALYSIS, ROUTINE W REFLEX MICROSCOPIC
BILIRUBIN URINE: NEGATIVE
Glucose, UA: NEGATIVE mg/dL
HGB URINE DIPSTICK: NEGATIVE
KETONES UR: NEGATIVE mg/dL
NITRITE: NEGATIVE
PROTEIN: NEGATIVE mg/dL
Specific Gravity, Urine: 1.025 (ref 1.005–1.030)
pH: 6 (ref 5.0–8.0)

## 2015-09-16 LAB — URINE MICROSCOPIC-ADD ON

## 2015-09-16 LAB — WET PREP, GENITAL
SPERM: NONE SEEN
TRICH WET PREP: NONE SEEN

## 2015-09-16 LAB — POCT PREGNANCY, URINE: PREG TEST UR: NEGATIVE

## 2015-09-16 MED ORDER — METRONIDAZOLE 500 MG PO TABS: 500.0000 mg | ORAL_TABLET | Freq: Two times a day (BID) | ORAL | 0 refills | Status: DC

## 2015-09-16 MED ORDER — FLUCONAZOLE 150 MG PO TABS: 150.0000 mg | ORAL_TABLET | Freq: Every day | ORAL | 1 refills | Status: DC

## 2015-09-16 NOTE — MAU Note (Signed)
Pt has had some vaginal irritation.  She has some creamy discharge that is chunky and white.  It also has an odor and she has some itching.  Denies VB/cramping.

## 2015-09-16 NOTE — Discharge Instructions (Signed)
Bacterial Vaginosis °Bacterial vaginosis is a vaginal infection that occurs when the normal balance of bacteria in the vagina is disrupted. It results from an overgrowth of certain bacteria. This is the most common vaginal infection in women of childbearing age. Treatment is important to prevent complications, especially in pregnant women, as it can cause a premature delivery. °CAUSES  °Bacterial vaginosis is caused by an increase in harmful bacteria that are normally present in smaller amounts in the vagina. Several different kinds of bacteria can cause bacterial vaginosis. However, the reason that the condition develops is not fully understood. °RISK FACTORS °Certain activities or behaviors can put you at an increased risk of developing bacterial vaginosis, including: °· Having a new sex partner or multiple sex partners. °· Douching. °· Using an intrauterine device (IUD) for contraception. °Women do not get bacterial vaginosis from toilet seats, bedding, swimming pools, or contact with objects around them. °SIGNS AND SYMPTOMS  °Some women with bacterial vaginosis have no signs or symptoms. Common symptoms include: °· Grey vaginal discharge. °· A fishlike odor with discharge, especially after sexual intercourse. °· Itching or burning of the vagina and vulva. °· Burning or pain with urination. °DIAGNOSIS  °Your health care provider will take a medical history and examine the vagina for signs of bacterial vaginosis. A sample of vaginal fluid may be taken. Your health care provider will look at this sample under a microscope to check for bacteria and abnormal cells. A vaginal pH test may also be done.  °TREATMENT  °Bacterial vaginosis may be treated with antibiotic medicines. These may be given in the form of a pill or a vaginal cream. A second round of antibiotics may be prescribed if the condition comes back after treatment. Because bacterial vaginosis increases your risk for sexually transmitted diseases, getting  treated can help reduce your risk for chlamydia, gonorrhea, HIV, and herpes. °HOME CARE INSTRUCTIONS  °· Only take over-the-counter or prescription medicines as directed by your health care provider. °· If antibiotic medicine was prescribed, take it as directed. Make sure you finish it even if you start to feel better. °· During treatment, it is important that you follow these instructions: °¨ Avoid sexual activity or use condoms correctly. °¨ Do not douche. °¨ Avoid alcohol as directed by your health care provider. °¨ Avoid breastfeeding as directed by your health care provider. °SEEK MEDICAL CARE IF:  °· Your symptoms are not improving after 3 days of treatment. °· You have increased discharge or pain. °· You have a fever. °MAKE SURE YOU:  °· Understand these instructions. °· Will watch your condition. °· Will get help right away if you are not doing well or get worse. °FOR MORE INFORMATION  °Centers for Disease Control and Prevention, Division of STD Prevention: www.cdc.gov/std °American Sexual Health Association (ASHA): www.ashastd.org  °  °This information is not intended to replace advice given to you by your health care provider. Make sure you discuss any questions you have with your health care provider. °  °Document Released: 01/19/2005 Document Revised: 02/09/2014 Document Reviewed: 08/31/2012 °Elsevier Interactive Patient Education ©2016 [employer] ° °Monilial Vaginitis °Vaginitis in a soreness, swelling and redness (inflammation) of the vagina and vulva. Monilial vaginitis is not a sexually transmitted infection. °CAUSES  °Yeast vaginitis is caused by yeast (candida) that is normally found in your vagina. With a yeast infection, the candida has overgrown in number to a point that upsets the chemical balance. °SYMPTOMS  °· White, thick vaginal discharge. °· Swelling, itching, redness   and irritation of the vagina and possibly the lips of the vagina (vulva). °· Burning or painful urination. °· Painful  intercourse. °DIAGNOSIS  °Things that may contribute to monilial vaginitis are: °· Postmenopausal and virginal states. °· Pregnancy. °· Infections. °· Being tired, sick or stressed, especially if you had monilial vaginitis in the past. °· Diabetes. Good control will help lower the chance. °· Birth control pills. °· Tight fitting garments. °· Using bubble bath, feminine sprays, douches or deodorant tampons. °· Taking certain medications that kill germs (antibiotics). °· Sporadic recurrence can occur if you become ill. °TREATMENT  °Your caregiver will give you medication. °· There are several kinds of anti monilial vaginal creams and suppositories specific for monilial vaginitis. For recurrent yeast infections, use a suppository or cream in the vagina 2 times a week, or as directed. °· Anti-monilial or steroid cream for the itching or irritation of the vulva may also be used. Get your caregiver's permission. °· Painting the vagina with methylene blue solution may help if the monilial cream does not work. °· Eating yogurt may help prevent monilial vaginitis. °HOME CARE INSTRUCTIONS  °· Finish all medication as prescribed. °· Do not have sex until treatment is completed or after your caregiver tells you it is okay. °· Take warm sitz baths. °· Do not douche. °· Do not use tampons, especially scented ones. °· Wear cotton underwear. °· Avoid tight pants and panty hose. °· Tell your sexual partner that you have a yeast infection. They should go to their caregiver if they have symptoms such as mild rash or itching. °· Your sexual partner should be treated as well if your infection is difficult to eliminate. °· Practice safer sex. Use condoms. °· Some vaginal medications cause latex condoms to fail. Vaginal medications that harm condoms are: °¨ Cleocin cream. °¨ Butoconazole (Femstat®). °¨ Terconazole (Terazol®) vaginal suppository. °¨ Miconazole (Monistat®) (may be purchased over the counter). °SEEK MEDICAL CARE IF:  °· You  have a temperature by mouth above 102° F (38.9° C). °· The infection is getting worse after 2 days of treatment. °· The infection is not getting better after 3 days of treatment. °· You develop blisters in or around your vagina. °· You develop vaginal bleeding, and it is not your menstrual period. °· You have pain when you urinate. °· You develop intestinal problems. °· You have pain with sexual intercourse. °  °This information is not intended to replace advice given to you by your health care provider. Make sure you discuss any questions you have with your health care provider. °  °Document Released: 10/29/2004 Document Revised: 04/13/2011 Document Reviewed: 07/23/2014 °Elsevier Interactive Patient Education ©2016 Elsevier Inc. ° °

## 2015-09-16 NOTE — MAU Note (Signed)
Discharge instructions given to pt, verbalized understanding, signature pad not working in room

## 2015-09-16 NOTE — MAU Provider Note (Signed)
History     CSN: [id number]  Arrival date and time: 09/16/15 1705   First Provider Initiated Contact with Patient 09/16/15 2011      Chief Complaint  Patient presents with  . Vaginal Discharge   Kayla Mckay is a [age] y.o. Female who presents for vaginal discharge & irritation.   Vaginal Discharge  The patient's primary symptoms include genital itching, a genital odor and vaginal discharge. The patient's pertinent negatives include no genital lesions, genital rash, missed menses, pelvic pain or vaginal bleeding. This is a new problem. Episode onset: 1.5 weeks. The problem occurs constantly. The problem has been unchanged. The patient is experiencing no pain. She is not pregnant. Pertinent negatives include no abdominal pain, discolored urine, dysuria, frequency, painful intercourse or vomiting. The vaginal discharge was thick, white and malodorous. There has been no bleeding. Nothing aggravates the symptoms. She has tried nothing for the symptoms. She is sexually active. No, her partner does not have an STD. She uses condoms for contraception. Her menstrual history has been regular. Her past medical history is significant for an STD and vaginosis. There is no history of PID.    OB History    No data available      Past Medical History:  Diagnosis Date  . Acne   . Atopic dermatitis   . PCOS (polycystic ovarian syndrome)     History reviewed. No pertinent surgical history.  History reviewed. No pertinent family history.  Social History  Substance Use Topics  . Smoking status: Never Smoker  . Smokeless tobacco: Never Used  . Alcohol use No    Allergies: No Known Allergies  No prescriptions prior to admission.    Review of Systems  Constitutional: Negative.   Gastrointestinal: Negative.  Negative for abdominal pain and vomiting.  Genitourinary: Positive for vaginal discharge. Negative for dysuria, frequency, missed menses and pelvic pain.       + vaginal discharge &  irritation No vaginal bleeding or dyspareunia   Physical Exam   Blood pressure 122/74, pulse 66, temperature 97.7 F (36.5 C), temperature source Oral, resp. rate 16.  Physical Exam  Nursing note and vitals reviewed. Constitutional: She is oriented to person, place, and time. She appears well-developed and well-nourished. No distress.  HENT:  Head: Normocephalic and atraumatic.  Eyes: Conjunctivae are normal. Right eye exhibits no discharge. Left eye exhibits no discharge. No scleral icterus.  Neck: Normal range of motion.  Cardiovascular: Normal rate.   Respiratory: Effort normal. No respiratory distress.  Genitourinary: Cervix exhibits no motion tenderness and no friability. There is erythema in the vagina. No bleeding in the vagina. Vaginal discharge (moderate amount of thick clumpy discharge) found.  Neurological: She is alert and oriented to person, place, and time.  Skin: Skin is warm and dry. She is not diaphoretic.  Psychiatric: She has a normal mood and affect. Her behavior is normal. Judgment and thought content normal.    MAU Course  Procedures Results for orders placed or performed during the hospital encounter of 09/16/15 (from the past 24 hour(s))  Urinalysis, Routine w reflex microscopic ([facility])     Status: Abnormal   Collection Time: 09/16/15  7:08 PM  Result Value Ref Range   Color, Urine YELLOW YELLOW   APPearance CLEAR CLEAR   Specific Gravity, Urine 1.025 1.005 - 1.030   pH 6.0 5.0 - 8.0   Glucose, UA NEGATIVE NEGATIVE mg/dL   Hgb urine dipstick NEGATIVE NEGATIVE   Bilirubin Urine NEGATIVE NEGATIVE  Ketones, ur NEGATIVE NEGATIVE mg/dL   Protein, ur NEGATIVE NEGATIVE mg/dL   Nitrite NEGATIVE NEGATIVE   Leukocytes, UA TRACE (A) NEGATIVE  Urine microscopic-add on     Status: Abnormal   Collection Time: 09/16/15  7:08 PM  Result Value Ref Range   Squamous Epithelial / LPF 6-30 (A) NONE SEEN   WBC, UA 0-5 0 - 5 WBC/hpf   RBC / HPF 0-5 0 - 5 RBC/hpf    Bacteria, UA FEW (A) NONE SEEN   Urine-Other MUCOUS PRESENT   Wet prep, genital     Status: Abnormal   Collection Time: 09/16/15  7:30 PM  Result Value Ref Range   Yeast Wet Prep HPF POC PRESENT (A) NONE SEEN   Trich, Wet Prep NONE SEEN NONE SEEN   Clue Cells Wet Prep HPF POC PRESENT (A) NONE SEEN   WBC, Wet Prep HPF POC MODERATE (A) NONE SEEN   Sperm NONE SEEN   Pregnancy, urine POC     Status: None   Collection Time: 09/16/15  7:40 PM  Result Value Ref Range   Preg Test, Ur NEGATIVE NEGATIVE    MDM UPT negative GC/CT & wet prep S/w Dr. Elon SpannerLeger regarding patient. Ok with plan to discharge pt w/treatment for BV & yeast Assessment and Plan  A: 1. Vaginal yeast infection   2. BV (bacterial vaginosis)     P: Discharge home Rx flagyl & diflucan GC/CT pending F/u with gyn as needed  Kayla Mckay 09/16/2015, 7:57 PM

## 2015-09-17 LAB — GC/CHLAMYDIA PROBE AMP (~~LOC~~) NOT AT ARMC
Chlamydia: NEGATIVE
Neisseria Gonorrhea: NEGATIVE

## 2016-01-02 ENCOUNTER — Ambulatory Visit: Payer: 59 | Admitting: Internal Medicine

## 2016-01-02 DIAGNOSIS — Z0289 Encounter for other administrative examinations: Secondary | ICD-10-CM

## 2017-02-02 NOTE — L&D Delivery Note (Signed)
Delivery Note  SVD nonviable female Apgars 0,0 over intact perineum attended by L&D RN.  Upon my arrival the demised infant was in the mothers arms.  Inspection revealed no pulse, movement or resp effort c/w known IUFD.  No obvious morphology abnormalities.    Placenta delivered spontaneously intact with 3VC. Hemostasis noted and MEU did not reveal any retained products.  Uterus was firm.  Continue with comfort care   EBL 50cc  Candice Camp, MD

## 2017-02-24 ENCOUNTER — Inpatient Hospital Stay (HOSPITAL_COMMUNITY)
Admission: AD | Admit: 2017-02-24 | Discharge: 2017-02-24 | Disposition: A | Discharge status: Home / Self Care | Payer: 59 | Source: Ambulatory Visit | Attending: Obstetrics and Gynecology | Admitting: Obstetrics and Gynecology

## 2017-02-24 ENCOUNTER — Encounter (HOSPITAL_COMMUNITY): Payer: Self-pay | Admitting: *Deleted

## 2017-02-24 ENCOUNTER — Inpatient Hospital Stay (HOSPITAL_COMMUNITY): Payer: 59

## 2017-02-24 ENCOUNTER — Other Ambulatory Visit: Payer: Self-pay

## 2017-02-24 DIAGNOSIS — O99281 Endocrine, nutritional and metabolic diseases complicating pregnancy, first trimester: Secondary | ICD-10-CM | POA: Diagnosis not present

## 2017-02-24 DIAGNOSIS — Z809 Family history of malignant neoplasm, unspecified: Secondary | ICD-10-CM | POA: Diagnosis not present

## 2017-02-24 DIAGNOSIS — N760 Acute vaginitis: Secondary | ICD-10-CM

## 2017-02-24 DIAGNOSIS — R110 Nausea: Secondary | ICD-10-CM | POA: Diagnosis not present

## 2017-02-24 DIAGNOSIS — O208 Other hemorrhage in early pregnancy: Secondary | ICD-10-CM | POA: Diagnosis present

## 2017-02-24 DIAGNOSIS — Z3A01 Less than 8 weeks gestation of pregnancy: Secondary | ICD-10-CM | POA: Insufficient documentation

## 2017-02-24 DIAGNOSIS — O209 Hemorrhage in early pregnancy, unspecified: Secondary | ICD-10-CM | POA: Diagnosis not present

## 2017-02-24 DIAGNOSIS — O23591 Infection of other part of genital tract in pregnancy, first trimester: Secondary | ICD-10-CM | POA: Diagnosis not present

## 2017-02-24 DIAGNOSIS — B9689 Other specified bacterial agents as the cause of diseases classified elsewhere: Secondary | ICD-10-CM | POA: Insufficient documentation

## 2017-02-24 DIAGNOSIS — O26891 Other specified pregnancy related conditions, first trimester: Secondary | ICD-10-CM | POA: Diagnosis not present

## 2017-02-24 DIAGNOSIS — E282 Polycystic ovarian syndrome: Secondary | ICD-10-CM | POA: Diagnosis not present

## 2017-02-24 DIAGNOSIS — Z79899 Other long term (current) drug therapy: Secondary | ICD-10-CM | POA: Diagnosis not present

## 2017-02-24 HISTORY — DX: Polycystic ovarian syndrome: E28.2

## 2017-02-24 LAB — URINALYSIS, ROUTINE W REFLEX MICROSCOPIC
BILIRUBIN URINE: NEGATIVE
Bacteria, UA: NONE SEEN
Glucose, UA: NEGATIVE mg/dL
KETONES UR: NEGATIVE mg/dL
LEUKOCYTES UA: NEGATIVE
Nitrite: NEGATIVE
PH: 6 (ref 5.0–8.0)
Protein, ur: NEGATIVE mg/dL
Specific Gravity, Urine: 1.025 (ref 1.005–1.030)

## 2017-02-24 LAB — CBC
HEMATOCRIT: 39.1 % (ref 36.0–46.0)
HEMOGLOBIN: 12.9 g/dL (ref 12.0–15.0)
MCH: 27.6 pg (ref 26.0–34.0)
MCHC: 33 g/dL (ref 30.0–36.0)
MCV: 83.5 fL (ref 78.0–100.0)
Platelets: 260 10*3/uL (ref 150–400)
RBC: 4.68 MIL/uL (ref 3.87–5.11)
RDW: 13.6 % (ref 11.5–15.5)
WBC: 6.9 10*3/uL (ref 4.0–10.5)

## 2017-02-24 LAB — WET PREP, GENITAL
SPERM: NONE SEEN
Trich, Wet Prep: NONE SEEN
YEAST WET PREP: NONE SEEN

## 2017-02-24 LAB — ABO/RH: ABO/RH(D): O POS

## 2017-02-24 LAB — HCG, QUANTITATIVE, PREGNANCY: hCG, Beta Chain, Quant, S: 19341 m[IU]/mL — ABNORMAL HIGH (ref <5)

## 2017-02-24 LAB — POCT PREGNANCY, URINE: Preg Test, Ur: POSITIVE — AB

## 2017-02-24 MED ORDER — METRONIDAZOLE 500 MG PO TABS: 500.0000 mg | ORAL_TABLET | Freq: Two times a day (BID) | ORAL | 0 refills | Status: DC

## 2017-02-24 NOTE — MAU Note (Signed)
Pt presents with c/o VB that began this morning.  Reports awoke to blood in panties & with wiping.  Pt states bleeding is like "a flow".  Denies abdominal pain or cramping.

## 2017-02-24 NOTE — MAU Note (Signed)
Noted blood in underwear and when she wiped this morning, passed a few small clots (nickel sized), bright red.  Denies pain, no recent intercourse. US on Monday in office, viable IUP

## 2017-02-24 NOTE — Discharge Instructions (Signed)
Pelvic rest while having vaginal bleeding. Keep your appointment in the office on 03-05-17.  Some vaginal bleeding may be expected with this subchorionic hemorrhage.  If you have bleeding that is concerning, call the office. No smoking, no drugs, no alcohol.   Take a prenatal vitamin one by mouth every day.   Eat small frequent snacks to avoid nausea.

## 2017-02-24 NOTE — MAU Provider Note (Signed)
History     CSN: [id number]  Arrival date and time: 02/24/17 0909   First Provider Initiated Contact with Patient 02/24/17 1020      Chief Complaint  Patient presents with  . Vaginal Bleeding   HPI Kayla Mckay 22 y.o. [redacted]w[redacted]d  Has been seen in the office for confirmation of pregnancy and one ultrasound in the office due to vaginal bleeding - saw the heartbeat at that ultrasound.  Today awakened with more vaginal bleeding although less than a menstrual period.  No abdominal pain.  Has not had breakfast today.  Having some nausea.  Has her first appointment for full exam for prenatal care on 03-05-17.  Usually sees Dr. Langston Masker in the office.  Her past history includes having Depo for 5 years and then no menses for one year.  Thought she would have trouble getting pregnant as she has been diagnosed with PCOS.  Menses have been monthly, predictable and lasting 3-5 days once she began having periods after Depo.  OB History    Gravida Para Term Preterm AB Living   1             SAB TAB Ectopic Multiple Live Births                  Past Medical History:  Diagnosis Date  . Acne   . Atopic dermatitis   . PCOS (polycystic ovarian syndrome)   . Polycystic ovary syndrome     Past Surgical History:  Procedure Laterality Date  . NO PAST SURGERIES      Family History  Problem Relation Age of Onset  . Cancer Maternal Grandmother   . Cancer Paternal Grandfather     Social History   Tobacco Use  . Smoking status: Never Smoker  . Smokeless tobacco: Never Used  Substance Use Topics  . Alcohol use: Yes    Comment: socially  . Drug use: No    Allergies: No Known Allergies  Medications Prior to Admission  Medication Sig Dispense Refill Last Dose  . fluconazole (DIFLUCAN) 150 MG tablet Take 1 tablet (150 mg total) by mouth daily. 1 tablet 1   . metroNIDAZOLE (FLAGYL) 500 MG tablet Take 1 tablet (500 mg total) by mouth 2 (two) times daily. 14 tablet 0     Review of Systems   Constitutional: Negative for fever.  Gastrointestinal: Positive for nausea. Negative for abdominal pain, constipation and vomiting.       Nausea today as she has not had anything to eat today.  Genitourinary: Positive for vaginal bleeding. Negative for dysuria and vaginal discharge.   Physical Exam   Blood pressure 122/85, pulse 82, temperature 97.9 F (36.6 C), temperature source Oral, resp. rate 17, height 5\' 4"  (1.626 m), weight 203 lb (92.1 kg), last menstrual period 02/11/2016, SpO2 100 %.  Physical Exam  Nursing note and vitals reviewed. Constitutional: She is oriented to person, place, and time. She appears well-developed and well-nourished.  HENT:  Head: Normocephalic.  Eyes: EOM are normal.  Neck: Neck supple.  GI: Soft. There is no tenderness. There is no rebound and no guarding.  Genitourinary:  Genitourinary Comments: Speculum exam: Vagina - Small amount of light red bleeding Cervix - small amount of mucousy active bleeding Bimanual exam: Cervix closed and thick Uterus non tender,unable to size due to habitus Adnexa non tender, no masses bilaterally GC/Chlam, wet prep done Chaperone present for exam.   Musculoskeletal: Normal range of motion.  Neurological: She is alert and  oriented to person, place, and time.  Skin: Skin is warm and dry.  Psychiatric: She has a normal mood and affect.    MAU Course  Procedures Results for orders placed or performed during the hospital encounter of 02/24/17 (from the past 24 hour(s))  Urinalysis, Routine w reflex microscopic     Status: Abnormal   Collection Time: 02/24/17  9:36 AM  Result Value Ref Range   Color, Urine YELLOW YELLOW   APPearance HAZY (A) CLEAR   Specific Gravity, Urine 1.025 1.005 - 1.030   pH 6.0 5.0 - 8.0   Glucose, UA NEGATIVE NEGATIVE mg/dL   Hgb urine dipstick LARGE (A) NEGATIVE   Bilirubin Urine NEGATIVE NEGATIVE   Ketones, ur NEGATIVE NEGATIVE mg/dL   Protein, ur NEGATIVE NEGATIVE mg/dL   Nitrite  NEGATIVE NEGATIVE   Leukocytes, UA NEGATIVE NEGATIVE   RBC / HPF 0-5 0 - 5 RBC/hpf   WBC, UA 0-5 0 - 5 WBC/hpf   Bacteria, UA NONE SEEN NONE SEEN   Squamous Epithelial / LPF 0-5 (A) NONE SEEN   Mucus PRESENT   Pregnancy, urine POC     Status: Abnormal   Collection Time: 02/24/17  9:42 AM  Result Value Ref Range   Preg Test, Ur POSITIVE (A) NEGATIVE  Wet prep, genital     Status: Abnormal   Collection Time: 02/24/17 10:32 AM  Result Value Ref Range   Yeast Wet Prep HPF POC NONE SEEN NONE SEEN   Trich, Wet Prep NONE SEEN NONE SEEN   Clue Cells Wet Prep HPF POC PRESENT (A) NONE SEEN   WBC, Wet Prep HPF POC MODERATE (A) NONE SEEN   Sperm NONE SEEN   CBC     Status: None   Collection Time: 02/24/17 10:58 AM  Result Value Ref Range   WBC 6.9 4.0 - 10.5 K/uL   RBC 4.68 3.87 - 5.11 MIL/uL   Hemoglobin 12.9 12.0 - 15.0 g/dL   HCT 11.939.1 14.736.0 - 82.946.0 %   MCV 83.5 78.0 - 100.0 fL   MCH 27.6 26.0 - 34.0 pg   MCHC 33.0 30.0 - 36.0 g/dL   RDW 56.213.6 13.011.5 - 86.515.5 %   Platelets 260 150 - 400 K/uL  hCG, quantitative, pregnancy     Status: Abnormal   Collection Time: 02/24/17 10:58 AM  Result Value Ref Range   hCG, Beta Chain, Quant, S 19,341 (H) <5 mIU/mL  ABO/Rh     Status: None   Collection Time: 02/24/17 10:58 AM  Result Value Ref Range   ABO/RH(D) O POS    CLINICAL DATA:  Vaginal bleeding  EXAM: OBSTETRIC <14 WK US AND TRANSVAGINAL OB US  TECHNIQUE: Both transabdominal and transvaginal ultrasound examinations were performed for complete evaluation of the gestation as well as the maternal uterus, adnexal regions, and pelvic cul-de-sac. Transvaginal technique was performed to assess early pregnancy.  COMPARISON:  None.  FINDINGS: Intrauterine gestational sac: Single  Yolk sac:  Visualized  Embryo:  Visualized  Cardiac Activity: Visualized  Heart Rate: 110 bpm  MSD:   mm    w     d  CRL:  4.5 mm   6 w   1 d                  US EDC: 10/19/2017  Subchorionic  hemorrhage:  Small subchorionic hemorrhage  Maternal uterus/adnexae: Small complex cyst in the right ovary measures 2.7 cm, likely hemorrhagic cyst. Left corpus luteal cyst. No free fluid.  IMPRESSION: Six week 1 day intrauterine pregnancy. Fetal heart rate 110 beats per minute. Small subchorionic hemorrhage.   MDM Dr. Marcelle Overlie consulted and plan of care discussed.  Subchorionic hemorrhage seen on ultrasound and that is likely the cause of her vaginal bleeding.  Will follow up in the office at her scheduled NOB appt on 03-05-17. Reviewed findings of subchorionic hemorrhage and BV with client as well as treatment plans.  Discussed importance of pelvic rest when having vaginal bleeding.  Client plans to keep her appointment in early February.  Reviewed the pharmacy for where to send her medication.  Assessment and Plan  Vaginal bleeding in pregnancy, first trimester from subchorionic hemorrhage Bacterial vaginosis  Plan Pelvic rest while having vaginal bleeding. Keep your appointment in the office on 03-05-17.  Some vaginal bleeding may be expected with this subchorionic hemorrhage.  If you have bleeding that is concerning, call the office. No smoking, no drugs, no alcohol.   Take a prenatal vitamin one by mouth every day.   Eat small frequent snacks to avoid nausea.   Will treat BP with metronidazole 500 mg PO BID x 7 days  - client requested to use PO medication.  Mirna Sutcliffe L Carrina Schoenberger 02/24/2017, 10:29 AM

## 2017-02-25 LAB — GC/CHLAMYDIA PROBE AMP (~~LOC~~) NOT AT ARMC
Chlamydia: NEGATIVE
Neisseria Gonorrhea: NEGATIVE

## 2017-02-25 LAB — HIV ANTIBODY (ROUTINE TESTING W REFLEX): HIV SCREEN 4TH GENERATION: NONREACTIVE

## 2017-02-25 LAB — RPR: RPR Ser Ql: NONREACTIVE

## 2017-04-27 ENCOUNTER — Inpatient Hospital Stay (HOSPITAL_COMMUNITY): Payer: 59

## 2017-04-27 ENCOUNTER — Encounter (HOSPITAL_COMMUNITY): Payer: Self-pay | Admitting: *Deleted

## 2017-04-27 ENCOUNTER — Inpatient Hospital Stay (HOSPITAL_COMMUNITY)
Admission: AD | Admit: 2017-04-27 | Discharge: 2017-04-27 | Disposition: A | Discharge status: Home / Self Care | Payer: 59 | Source: Ambulatory Visit | Attending: Obstetrics and Gynecology | Admitting: Obstetrics and Gynecology

## 2017-04-27 DIAGNOSIS — O468X2 Other antepartum hemorrhage, second trimester: Secondary | ICD-10-CM | POA: Diagnosis not present

## 2017-04-27 DIAGNOSIS — O209 Hemorrhage in early pregnancy, unspecified: Secondary | ICD-10-CM | POA: Insufficient documentation

## 2017-04-27 DIAGNOSIS — Z3A15 15 weeks gestation of pregnancy: Secondary | ICD-10-CM | POA: Insufficient documentation

## 2017-04-27 DIAGNOSIS — O418X20 Other specified disorders of amniotic fluid and membranes, second trimester, not applicable or unspecified: Secondary | ICD-10-CM

## 2017-04-27 LAB — URINALYSIS, ROUTINE W REFLEX MICROSCOPIC
Bilirubin Urine: NEGATIVE
Glucose, UA: NEGATIVE mg/dL
Ketones, ur: NEGATIVE mg/dL
NITRITE: NEGATIVE
PH: 6 (ref 5.0–8.0)
Protein, ur: 30 mg/dL — AB
SPECIFIC GRAVITY, URINE: 1.023 (ref 1.005–1.030)

## 2017-04-27 NOTE — MAU Note (Addendum)
PT SAYS SHE WOKE UP AT 0530-  WITH BLOOD IN HER  BED.   IN TRIAGE -   SHE HAS SMEARS ON HER THIGHS-   NONE  IN CLOTHES.     WAS HERE BEFORE   WITH BLEEDING. .   NO SEX-  X3 WEEKS.  NO CRAMPS.   [facility]-   DR MORRIS.  HAS AN APPOINTMENT  TODAY  - 250PM.    ON SAT - HAD U/S  TINY TOES-   GET GENDER- ALL OK.

## 2017-04-27 NOTE — MAU Note (Signed)
Pt presents with c/o VB @ 0530.  States she's actively bleeding but VB has lessened.  Pt reports history of subchorionic hemorrhage with pregnancy.

## 2017-04-27 NOTE — MAU Provider Note (Addendum)
History     CSN: [id number]  Arrival date and time: 04/27/17 4098   First Provider Initiated Contact with Patient 04/27/17 [phone]      Chief Complaint  Patient presents with  . Vaginal Bleeding   HPI  [name] is a 22 y.o. year old G1P0 female at [redacted]w[redacted]d weeks gestation who presents to MAU reporting awakening with multiples spots of blood on bed sheets and running down her legs @ 0530. She reports she is actively bleeding, but it has slowed way down from when it was at home. She wiped a few times and the bleeding continued to be bright, red. She has a h/o [facility] with this pregnancy. She reports having a Tiny Toes U/S on 3/23 and "everything was fine". She denies any recent SI; none in 3 wks. She receives [facility] and has appt at 1450 today with Dr. Langston Masker.   Past Medical History:  Diagnosis Date  . Acne   . Atopic dermatitis   . PCOS (polycystic ovarian syndrome)   . Polycystic ovary syndrome     Past Surgical History:  Procedure Laterality Date  . NO PAST SURGERIES      Family History  Problem Relation Age of Onset  . Cancer Maternal Grandmother   . Cancer Paternal Grandfather     Social History   Tobacco Use  . Smoking status: Never Smoker  . Smokeless tobacco: Never Used  Substance Use Topics  . Alcohol use: Yes    Comment: socially  . Drug use: No    Allergies: No Known Allergies  Medications Prior to Admission  Medication Sig Dispense Refill Last Dose  . Prenatal Vit-Fe Fumarate-FA (PRENATAL MULTIVITAMIN) TABS tablet Take 1 tablet by mouth daily at 12 noon.   04/26/2017 at Unknown time  . metroNIDAZOLE (FLAGYL) 500 MG tablet Take 1 tablet (500 mg total) by mouth 2 (two) times daily. No alcohol while taking this medication 14 tablet 0     Review of Systems  Constitutional: Negative.   HENT: Negative.   Eyes: Negative.   Respiratory: Negative.   Cardiovascular: Negative.   Gastrointestinal: Negative.   Endocrine: Negative.   Genitourinary:  Positive for vaginal bleeding.  Musculoskeletal: Negative.   Skin: Negative.   Allergic/Immunologic: Negative.   Neurological: Negative.   Hematological: Negative.   Psychiatric/Behavioral: Negative.    Physical Exam   Blood pressure 119/72, pulse 92, temperature 98.7 F (37.1 C), temperature source Oral, resp. rate 20, height 5\' 4"  (1.626 m), weight 208 lb 12 oz (94.7 kg), last menstrual period 01/10/2017.  Physical Exam  Nursing note and vitals reviewed. Constitutional: She is oriented to person, place, and time. She appears well-developed and well-nourished.  HENT:  Head: Normocephalic and atraumatic.  Eyes: Pupils are equal, round, and reactive to light.  Neck: Normal range of motion.  Cardiovascular: Normal rate, regular rhythm and normal heart sounds.  Respiratory: Effort normal and breath sounds normal.  GI: Soft. Bowel sounds are normal.  Genitourinary:  Genitourinary Comments: Uterus: non-tender, S=D, SE: cervix is smooth, pink, no lesions, scant amt of mucousy reddish-pink blood in cervical os, no CMT or friability, no adnexal tenderness   Musculoskeletal: Normal range of motion.  Neurological: She is alert and oriented to person, place, and time.  Skin: Skin is warm and dry.  Psychiatric: She has a normal mood and affect. Her behavior is normal. Judgment and thought content normal.    MAU Course  Procedures  MDM CCUA Pelvic exam OB  Limited U/S   Results for orders placed or performed during the hospital encounter of 04/27/17 (from the past 24 hour(s))  Urinalysis, Routine w reflex microscopic     Status: Abnormal   Collection Time: 04/27/17  7:14 AM  Result Value Ref Range   Color, Urine YELLOW YELLOW   APPearance HAZY (A) CLEAR   Specific Gravity, Urine 1.023 1.005 - 1.030   pH 6.0 5.0 - 8.0   Glucose, UA NEGATIVE NEGATIVE mg/dL   Hgb urine dipstick LARGE (A) NEGATIVE   Bilirubin Urine NEGATIVE NEGATIVE   Ketones, ur NEGATIVE NEGATIVE mg/dL   Protein, ur  30 (A) NEGATIVE mg/dL   Nitrite NEGATIVE NEGATIVE   Leukocytes, UA TRACE (A) NEGATIVE   RBC / HPF 0-5 0 - 5 RBC/hpf   WBC, UA 0-5 0 - 5 WBC/hpf   Bacteria, UA RARE (A) NONE SEEN   Squamous Epithelial / LPF 6-30 (A) NONE SEEN   Mucus PRESENT     [place] Mfm Ob Limited  Result Date: 04/27/2017 ----------------------------------------------------------------------  OBSTETRICS REPORT                      (Signed Final 04/27/2017 08:59 am) ---------------------------------------------------------------------- Patient Info  ID #:       [id number]                          D.O.B.:  02/17/1995 (21 yrs)  Name:       Kayla Mckay               Visit Date: 04/27/2017 08:07 am ---------------------------------------------------------------------- Performed By  Performed By:     Lenise Arena        Referred By:      MAU Nursing-                    RDMS                                     MAU/Triage  Attending:        Durwin Nora       Location:         [facility]                    MD ---------------------------------------------------------------------- Orders   #  Description                                 Code   1  [place] MFM OB LIMITED                           [phone]  ----------------------------------------------------------------------   #  Ordered By               Order #        Accession #    Episode #   1  Raelyn Mora           [id number]      [id number]     [id number]  ---------------------------------------------------------------------- Indications   [redacted] weeks gestation of pregnancy                Z3A.15   Vaginal bleeding in pregnancy, second          O46.92   trimester  ----------------------------------------------------------------------  OB History  Gravidity:    1 ---------------------------------------------------------------------- Fetal Evaluation  Num Of Fetuses:     1  Fetal Heart         157  Rate(bpm):  Cardiac Activity:   Observed  Presentation:       Cephalic  Placenta:            Posterior  P. Cord Insertion:  Visualized  Amniotic Fluid  AFI FV:      Subjectively within normal limits                              Largest Pocket(cm)                              4.49  Comment:    Lt. subchorionic hemorrhage visualized. 2.4 x 0.6 x 1.3 cm ---------------------------------------------------------------------- Gestational Age  LMP:           15w 2d        Date:  01/10/17                 EDD:   10/17/17  Best:          Hazle Quant 2d     Det. By:  LMP  (01/10/17)          EDD:   10/17/17 ---------------------------------------------------------------------- Anatomy  Stomach:               Appears normal, left   Bladder:                Appears normal                         sided ---------------------------------------------------------------------- Cervix Uterus Adnexa  Cervix  Normal appearance by transabdominal scan.  Uterus  Normal shape and size.  Left Ovary  Within normal limits.  Right Ovary  No adnexal mass visualized.  Cul De Sac:   No free fluid seen.  Adnexa:       No abnormality visualized. ---------------------------------------------------------------------- Impression  Single living intrauterine pregnancy at 15w 2d (remote read  only)  Cephalic presentation.  Placenta Posterior.  Normal amniotic fluid volume.  fetal morphology is gestational age appropriate on limited  survey  cervix is closed by transabdominal scan with no previa  normal appearing uterus and adnexa  incidental note is made of small, left-sided subchorionic  hemorrhage, measuring 2.4 x 0.6 x 1.3 cm ---------------------------------------------------------------------- Recommendations  Recommend fetal survey at 18 weeks.  Follow-up ultrasounds and management as clinically  indicated in the interim; ie, clinical correlation with patient  exam by on site OB provider is recommended. ----------------------------------------------------------------------               Durwin Nora, MD Electronically Signed Final Report    04/27/2017 08:59 am ----------------------------------------------------------------------    Report and Care assumed by L. Leftwich-Kirby, CNM  Raelyn Mora, MSN, CNM 04/27/2017, 8:00 AM   Assessment and Plan   Imaging: [place] Mfm Ob Limited  Result Date: 04/27/2017 ----------------------------------------------------------------------  OBSTETRICS REPORT                      (Signed Final 04/27/2017 08:59 am) ---------------------------------------------------------------------- Patient Info  ID #:       [id number]  D.O.B.:  1995/09/08 (21 yrs)  Name:       Kayla Mckay               Visit Date: 04/27/2017 08:07 am ---------------------------------------------------------------------- Performed By  Performed By:     Lenise ArenaHannah Bazemore        Referred By:      MAU Nursing-                    RDMS                                     MAU/Triage  Attending:        Durwin NoraJeffrey M Denney       [facility]                    MD ---------------------------------------------------------------------- Orders   #  Description                                 Code   1  US MFM OB LIMITED                           [phone] [id number]  ----------------------------------------------------------------------   #  Ordered By               Order #        Accession #    Episode #   1  Raelyn MoraOLITTA DAWSON           [id number]      [id number]     [id number]  ---------------------------------------------------------------------- Indications   [redacted] weeks gestation of pregnancy                Z3A.15   Vaginal bleeding in pregnancy, second          O46.92   trimester  ---------------------------------------------------------------------- OB History  Gravidity:    1 ---------------------------------------------------------------------- Fetal Evaluation  Num Of Fetuses:     1  Fetal Heart         157  Rate(bpm):  Cardiac Activity:   Observed  Presentation:       Cephalic  Placenta:           Posterior  P. Cord  Insertion:  Visualized  Amniotic Fluid  AFI FV:      Subjectively within normal limits                              Largest Pocket(cm)                              4.49  Comment:    Lt. subchorionic hemorrhage visualized. 2.4 x 0.6 x 1.3 cm ---------------------------------------------------------------------- Gestational Age  LMP:           15w 2d        Date:  01/10/17                 EDD:   10/17/17  Best:          Hazle Quant15w 2d     Det. By:  LMP  (01/10/17)          EDD:  10/17/17 ---------------------------------------------------------------------- Anatomy  Stomach:               Appears normal, left   Bladder:                Appears normal                         sided ---------------------------------------------------------------------- Cervix Uterus Adnexa  Cervix  Normal appearance by transabdominal scan.  Uterus  Normal shape and size.  Left Ovary  Within normal limits.  Right Ovary  No adnexal mass visualized.  Cul De Sac:   No free fluid seen.  Adnexa:       No abnormality visualized. ---------------------------------------------------------------------- Impression  Single living intrauterine pregnancy at 15w 2d (remote read  only)  Cephalic presentation.  Placenta Posterior.  Normal amniotic fluid volume.  fetal morphology is gestational age appropriate on limited  survey  cervix is closed by transabdominal scan with no previa  normal appearing uterus and adnexa  incidental note is made of small, left-sided subchorionic  hemorrhage, measuring 2.4 x 0.6 x 1.3 cm ---------------------------------------------------------------------- Recommendations  Recommend fetal survey at 18 weeks.  Follow-up ultrasounds and management as clinically  indicated in the interim; ie, clinical correlation with patient  exam by on site OB provider is recommended. ----------------------------------------------------------------------               Durwin Nora, MD Electronically Signed Final Report   04/27/2017 08:59 am  ----------------------------------------------------------------------    A: 1. Subchorionic hemorrhage of placenta in second trimester, single or unspecified fetus   2. Bleeding in early pregnancy     P: Consult Dr Vincente Poli with assessment and findings.  Bleeding is likely from known subchorionic hemorrhage.  With bleeding in second trimester, risks are increased so miscarriage precautions reviewed.  Reassurance provided, however that today's US shows normally developing 15 week pregnancy with small amount of bleeding.  D/C home with bleeding precautions Note for pt to miss work and school tomorrow to rest Pelvic rest recommended while bleeding F/U in office as scheduled today with Dr Langston Masker   Sharen Counter, CNM 9:26 AM

## 2017-07-01 ENCOUNTER — Encounter (HOSPITAL_COMMUNITY): Payer: Self-pay

## 2017-07-01 ENCOUNTER — Inpatient Hospital Stay (HOSPITAL_COMMUNITY)
Admission: AD | Admit: 2017-07-01 | Discharge: 2017-07-04 | DRG: 807 | Disposition: A | Discharge status: Home / Self Care | Payer: 59 | Attending: Obstetrics and Gynecology | Admitting: Obstetrics and Gynecology

## 2017-07-01 ENCOUNTER — Inpatient Hospital Stay (HOSPITAL_COMMUNITY): Payer: 59

## 2017-07-01 DIAGNOSIS — Z3A24 24 weeks gestation of pregnancy: Secondary | ICD-10-CM | POA: Diagnosis not present

## 2017-07-01 DIAGNOSIS — O364XX0 Maternal care for intrauterine death, not applicable or unspecified: Secondary | ICD-10-CM | POA: Diagnosis present

## 2017-07-01 LAB — CBC
HEMATOCRIT: 34.6 % — AB (ref 36.0–46.0)
HEMOGLOBIN: 11.1 g/dL — AB (ref 12.0–15.0)
MCH: 26.9 pg (ref 26.0–34.0)
MCHC: 32.1 g/dL (ref 30.0–36.0)
MCV: 83.8 fL (ref 78.0–100.0)
Platelets: 201 10*3/uL (ref 150–400)
RBC: 4.13 MIL/uL (ref 3.87–5.11)
RDW: 13.8 % (ref 11.5–15.5)
WBC: 8 10*3/uL (ref 4.0–10.5)

## 2017-07-01 MED ORDER — ACETAMINOPHEN 325 MG PO TABS
650.0000 mg | ORAL_TABLET | ORAL | Status: DC | PRN
  Administered 2017-07-02: 650 mg via ORAL
  Filled 2017-07-01: qty 2

## 2017-07-01 MED ORDER — MISOPROSTOL 200 MCG PO TABS
200.0000 ug | ORAL_TABLET | Freq: Four times a day (QID) | ORAL | Status: AC
  Administered 2017-07-01 – 2017-07-02 (×3): 200 ug via VAGINAL
  Filled 2017-07-01 (×4): qty 1

## 2017-07-01 MED ORDER — OXYCODONE-ACETAMINOPHEN 5-325 MG PO TABS: 2.0000 | ORAL_TABLET | ORAL | Status: DC | PRN

## 2017-07-01 MED ORDER — ONDANSETRON HCL 4 MG/2ML IJ SOLN
4.0000 mg | Freq: Four times a day (QID) | INTRAMUSCULAR | Status: DC | PRN
  Administered 2017-07-02: 4 mg via INTRAVENOUS
  Filled 2017-07-01: qty 2

## 2017-07-01 MED ORDER — LACTATED RINGERS IV SOLN
INTRAVENOUS | Status: DC
  Administered 2017-07-02: 09:00:00 via INTRAVENOUS

## 2017-07-01 MED ORDER — LACTATED RINGERS IV SOLN
500.0000 mL | INTRAVENOUS | Status: DC | PRN
Start: 2017-07-01 — End: 2017-07-02

## 2017-07-01 MED ORDER — OXYTOCIN 40 UNITS IN LACTATED RINGERS INFUSION - SIMPLE MED
2.5000 [IU]/h | INTRAVENOUS | Status: DC
  Filled 2017-07-01: qty 1000

## 2017-07-01 MED ORDER — LIDOCAINE HCL (PF) 1 % IJ SOLN
30.0000 mL | INTRAMUSCULAR | Status: DC | PRN
  Filled 2017-07-01: qty 30

## 2017-07-01 MED ORDER — SOD CITRATE-CITRIC ACID 500-334 MG/5ML PO SOLN: 30.0000 mL | ORAL | Status: DC | PRN

## 2017-07-01 MED ORDER — OXYTOCIN BOLUS FROM INFUSION
500.0000 mL | Freq: Once | INTRAVENOUS | Status: AC
  Administered 2017-07-02: 500 mL via INTRAVENOUS

## 2017-07-01 MED ORDER — BUTORPHANOL TARTRATE 1 MG/ML IJ SOLN
1.0000 mg | INTRAMUSCULAR | Status: DC | PRN
  Administered 2017-07-02 (×5): 1 mg via INTRAVENOUS
  Filled 2017-07-01 (×5): qty 1

## 2017-07-01 MED ORDER — OXYCODONE-ACETAMINOPHEN 5-325 MG PO TABS: 1.0000 | ORAL_TABLET | ORAL | Status: DC | PRN

## 2017-07-01 NOTE — MAU Note (Signed)
Pt sent from office for IUFD.  Wished too repeat U/S before admission.

## 2017-07-01 NOTE — H&P (Signed)
Kayla Mckay is a 22 y.o.G  1 P 0 at 24 weeks and 4 days presented to office with no fetal movement x 2 days. IUFD confirmed by office ultrasound and Doppler  Uncomplicated PNC until now  OB History    Gravida  1   Para      Term      Preterm      AB      Living        SAB      TAB      Ectopic      Multiple      Live Births             Past Medical History:  Diagnosis Date  . Acne   . Atopic dermatitis   . PCOS (polycystic ovarian syndrome)   . Polycystic ovary syndrome    Past Surgical History:  Procedure Laterality Date  . NO PAST SURGERIES     Family History: family history includes Cancer in her maternal grandmother and paternal grandfather. Social History:  reports that she has never smoked. She has never used smokeless tobacco. She reports that she drinks alcohol. She reports that she does not use drugs.     Maternal Diabetes: No Genetic Screening: Normal Maternal Ultrasounds/Referrals: Normal Fetal Ultrasounds or other Referrals:  Other:  Maternal Substance Abuse:  No Significant Maternal Medications:  None Significant Maternal Lab Results:  None Other Comments:  None  Review of Systems  All other systems reviewed and are negative.  Maternal Medical History:  Fetal activity: Perceived fetal activity is none.   Last perceived fetal movement was greater than 24 hours ago.        Blood pressure (!) 126/91, pulse 96, temperature 98.4 F (36.9 C), resp. rate 18, height  (1.626 m), weight 95.3 kg (210 lb), last menstrual period 01/10/2017. Maternal Exam:  Abdomen: Fetal presentation: vertex     Physical Exam  Nursing note and vitals reviewed. Constitutional: She appears well-developed and well-nourished.  HENT:  Head: Normocephalic.  Eyes: Pupils are equal, round, and reactive to light.  Neck: Normal range of motion.  Cardiovascular: Normal rate and regular rhythm.  Respiratory: Effort normal.  GI: Soft.    Prenatal  labs: ABO, Rh: --/--/O POS (01/23 1058) Antibody:   Rubella:   RPR: Non Reactive (01/23 1058)  HBsAg:    HIV: Non Reactive (01/23 1058)  GBS:     Assessment/Plan: IUP at 24 w 4 days IUFD  Admit Repeat Ultrasound for family who have arrived IOL - stadol and Cytotec Everything explained to the patient  Kayla Mckay L 07/01/2017, 4:29 PM

## 2017-07-02 ENCOUNTER — Inpatient Hospital Stay (HOSPITAL_COMMUNITY): Payer: 59 | Admitting: Anesthesiology

## 2017-07-02 ENCOUNTER — Encounter (HOSPITAL_COMMUNITY): Payer: Self-pay | Admitting: *Deleted

## 2017-07-02 ENCOUNTER — Other Ambulatory Visit: Payer: Self-pay

## 2017-07-02 LAB — TYPE AND SCREEN
ABO/RH(D): O POS
ANTIBODY SCREEN: NEGATIVE

## 2017-07-02 LAB — RAPID URINE DRUG SCREEN, HOSP PERFORMED
Amphetamines: NOT DETECTED
BENZODIAZEPINES: NOT DETECTED
Barbiturates: NOT DETECTED
Cocaine: NOT DETECTED
OPIATES: NOT DETECTED
Tetrahydrocannabinol: NOT DETECTED

## 2017-07-02 LAB — RPR: RPR: NONREACTIVE

## 2017-07-02 MED ORDER — SENNOSIDES-DOCUSATE SODIUM 8.6-50 MG PO TABS
2.0000 | ORAL_TABLET | ORAL | Status: DC
  Filled 2017-07-02: qty 2

## 2017-07-02 MED ORDER — LACTATED RINGERS IV SOLN: 500.0000 mL | Freq: Once | INTRAVENOUS | Status: DC

## 2017-07-02 MED ORDER — ZOLPIDEM TARTRATE 5 MG PO TABS: 5.0000 mg | ORAL_TABLET | Freq: Every evening | ORAL | Status: DC | PRN

## 2017-07-02 MED ORDER — BENZOCAINE-MENTHOL 20-0.5 % EX AERO: 1.0000 "application " | INHALATION_SPRAY | CUTANEOUS | Status: DC | PRN

## 2017-07-02 MED ORDER — MEASLES, MUMPS & RUBELLA VAC ~~LOC~~ INJ: 0.5000 mL | INJECTION | Freq: Once | SUBCUTANEOUS | Status: DC

## 2017-07-02 MED ORDER — EPHEDRINE 5 MG/ML INJ
10.0000 mg | INTRAVENOUS | Status: DC | PRN
  Filled 2017-07-02: qty 2

## 2017-07-02 MED ORDER — ONDANSETRON HCL 4 MG/2ML IJ SOLN: 4.0000 mg | INTRAMUSCULAR | Status: DC | PRN

## 2017-07-02 MED ORDER — SIMETHICONE 80 MG PO CHEW: 80.0000 mg | CHEWABLE_TABLET | ORAL | Status: DC | PRN

## 2017-07-02 MED ORDER — PHENYLEPHRINE 40 MCG/ML (10ML) SYRINGE FOR IV PUSH (FOR BLOOD PRESSURE SUPPORT): 80.0000 ug | PREFILLED_SYRINGE | INTRAVENOUS | Status: DC | PRN

## 2017-07-02 MED ORDER — WITCH HAZEL-GLYCERIN EX PADS: 1.0000 "application " | MEDICATED_PAD | CUTANEOUS | Status: DC | PRN

## 2017-07-02 MED ORDER — PHENYLEPHRINE 40 MCG/ML (10ML) SYRINGE FOR IV PUSH (FOR BLOOD PRESSURE SUPPORT)
80.0000 ug | PREFILLED_SYRINGE | INTRAVENOUS | Status: DC | PRN
  Filled 2017-07-02: qty 5
  Filled 2017-07-02: qty 10

## 2017-07-02 MED ORDER — IBUPROFEN 600 MG PO TABS
600.0000 mg | ORAL_TABLET | Freq: Four times a day (QID) | ORAL | Status: DC
  Administered 2017-07-03 (×2): 600 mg via ORAL
  Filled 2017-07-02 (×7): qty 1

## 2017-07-02 MED ORDER — EPHEDRINE 5 MG/ML INJ: 10.0000 mg | INTRAVENOUS | Status: DC | PRN

## 2017-07-02 MED ORDER — ACETAMINOPHEN 325 MG PO TABS: 650.0000 mg | ORAL_TABLET | ORAL | Status: DC | PRN

## 2017-07-02 MED ORDER — DIBUCAINE 1 % RE OINT: 1.0000 "application " | TOPICAL_OINTMENT | RECTAL | Status: DC | PRN

## 2017-07-02 MED ORDER — MISOPROSTOL 200 MCG PO TABS
200.0000 ug | ORAL_TABLET | Freq: Four times a day (QID) | ORAL | Status: DC
  Administered 2017-07-02: 200 ug via VAGINAL
  Filled 2017-07-02: qty 1

## 2017-07-02 MED ORDER — OXYCODONE-ACETAMINOPHEN 5-325 MG PO TABS: 2.0000 | ORAL_TABLET | ORAL | Status: DC | PRN

## 2017-07-02 MED ORDER — DIPHENHYDRAMINE HCL 50 MG/ML IJ SOLN: 12.5000 mg | INTRAMUSCULAR | Status: DC | PRN

## 2017-07-02 MED ORDER — ONDANSETRON HCL 4 MG PO TABS: 4.0000 mg | ORAL_TABLET | ORAL | Status: DC | PRN

## 2017-07-02 MED ORDER — DIPHENHYDRAMINE HCL 25 MG PO CAPS: 25.0000 mg | ORAL_CAPSULE | Freq: Four times a day (QID) | ORAL | Status: DC | PRN

## 2017-07-02 MED ORDER — LIDOCAINE HCL (PF) 1 % IJ SOLN
INTRAMUSCULAR | Status: DC | PRN
  Administered 2017-07-02: 5 mL via EPIDURAL

## 2017-07-02 MED ORDER — PHENYLEPHRINE 40 MCG/ML (10ML) SYRINGE FOR IV PUSH (FOR BLOOD PRESSURE SUPPORT)
80.0000 ug | PREFILLED_SYRINGE | INTRAVENOUS | Status: DC | PRN
  Filled 2017-07-02: qty 5

## 2017-07-02 MED ORDER — MEDROXYPROGESTERONE ACETATE 150 MG/ML IM SUSP: 150.0000 mg | INTRAMUSCULAR | Status: DC | PRN

## 2017-07-02 MED ORDER — TETANUS-DIPHTH-ACELL PERTUSSIS 5-2.5-18.5 LF-MCG/0.5 IM SUSP: 0.5000 mL | Freq: Once | INTRAMUSCULAR | Status: DC

## 2017-07-02 MED ORDER — OXYCODONE-ACETAMINOPHEN 5-325 MG PO TABS: 1.0000 | ORAL_TABLET | ORAL | Status: DC | PRN

## 2017-07-02 MED ORDER — FENTANYL 2.5 MCG/ML BUPIVACAINE 1/10 % EPIDURAL INFUSION (WH - ANES)
14.0000 mL/h | INTRAMUSCULAR | Status: DC | PRN
  Administered 2017-07-02: 14 mL/h via EPIDURAL
  Filled 2017-07-02: qty 100

## 2017-07-02 MED ORDER — PRENATAL MULTIVITAMIN CH
1.0000 | ORAL_TABLET | Freq: Every day | ORAL | Status: DC
  Filled 2017-07-02: qty 1

## 2017-07-02 NOTE — Progress Notes (Signed)
CSW received consult due to IUFD.  CSW available for support secondary to [employer] and will await call from Chaplain before becoming involved, however please call CSW if patient or family request to speak with a Clinical Child psychotherapistocial Worker.

## 2017-07-02 NOTE — Progress Notes (Signed)
Pt was reclined in bed when I arrived and holding Kayla Mckay. She became tearful, almost sobbing, as we talked. Her eyes remained on her baby during most of our visit. She is expecting a friend to come and spend the night with her. She said of her religious background that she is a [place] and she desired prayer. She cried during prayer but was very grateful. She asked when will "they" take her. I told her whenever she is ready. She is considering [name]. I shared with her the most recent costs for services and burial and encouraged to request a Chaplain if she needs additional information. Please page if further support is needed. Chaplain Marjory Liesamela Ralston Venus Holder, [place]   07/02/17 2300  Clinical Encounter Type  Visited With Patient

## 2017-07-02 NOTE — Progress Notes (Signed)
I offered support to Jasdeep and her family including her younger brother, her mother and her step-mother.  FOB, Joe, was at work and will be here aorund 7:00.  She is experiencing shock and grief.  She is very much worried that she did something wrong and needs reassurance about that to the extent possible.  She considered autopsy, but decided against it so that she could have more time with her baby. I provided Hospice grief counseling information as well as [facility] and my own card for ongoing grief support.  Chaplain Darcus Pester is on call this evening if needs arise.  Chaplain Dyanne Carrel, Bcc Pager, [phone] 3:41 PM   07/02/17 1500  Clinical Encounter Type  Visited With Patient and family together  Visit Type Spiritual support  Referral From Nurse  Consult/Referral To Hospice  Stress Factors  Patient Stress Factors Loss

## 2017-07-02 NOTE — Progress Notes (Signed)
Patient ID: Kayla Mckay, female   DOB: 1995/03/29, 22 y.o.   MRN: [id number] Pt somewhat groggy from stadol but alert enough to ask and answer questions. I reviewed and discussed plan of care including delivery, risk of D&C for placenta, epidural I discussed workup for IUFD, including labs and UDS I discussed autopsy and patient unsure at this time.  She does want to spend time with the baby after dellivery  VSSAF Ctxs moderate  IUFD at 24 weeks Cont cytotec Epidural TORCH labs, Parvo, and UDS Comfort care and social work to see DL

## 2017-07-02 NOTE — Progress Notes (Signed)
In to assess vital signs, but Patient is sitting up on edge of bed crying. Night shift nurse made aware.

## 2017-07-02 NOTE — Anesthesia Procedure Notes (Signed)
Epidural Patient location during procedure: OB Start time: 07/02/2017 9:42 AM End time: 07/02/2017 9:50 AM  Staffing Anesthesiologist: Shelton SilvasHollis, Chantele Corado D, MD Performed: anesthesiologist   Preanesthetic Checklist Completed: patient identified, site marked, surgical consent, pre-op evaluation, timeout performed, IV checked, risks and benefits discussed and monitors and equipment checked  Epidural Patient position: sitting Prep: ChloraPrep Patient monitoring: heart rate, continuous pulse ox and blood pressure Approach: midline Location: L3-L4 Injection technique: LOR saline  Needle:  Needle type: Tuohy  Needle gauge: 17 G Needle length: 9 cm Catheter type: closed end flexible Catheter size: 20 Guage Test dose: negative and 1.5% lidocaine  Assessment Events: blood not aspirated, injection not painful, no injection resistance and no paresthesia  Additional Notes LOR @ 6  Patient identified. Risks/Benefits/Options discussed with patient including but not limited to bleeding, infection, nerve damage, paralysis, failed block, incomplete pain control, headache, blood pressure changes, nausea, vomiting, reactions to medications, itching and postpartum back pain. Confirmed with bedside nurse the patient's most recent platelet count. Confirmed with patient that they are not currently taking any anticoagulation, have any bleeding history or any family history of bleeding disorders. Patient expressed understanding and wished to proceed. All questions were answered. Sterile technique was used throughout the entire procedure. Please see nursing notes for vital signs. Test dose was given through epidural catheter and negative prior to continuing to dose epidural or start infusion. Warning signs of high block given to the patient including shortness of breath, tingling/numbness in hands, complete motor block, or any concerning symptoms with instructions to call for help. Patient was given instructions on  fall risk and not to get out of bed. All questions and concerns addressed with instructions to call with any issues or inadequate analgesia.    Reason for block:procedure for pain

## 2017-07-02 NOTE — Anesthesia Preprocedure Evaluation (Signed)
Anesthesia Evaluation  Patient identified by MRN, date of birth, ID band Patient awake    Reviewed: Allergy & Precautions, Patient's Chart, lab work & pertinent test results  Airway Mallampati: II       Dental no notable dental hx.    Pulmonary    Pulmonary exam normal        Cardiovascular negative cardio ROS Normal cardiovascular exam     Neuro/Psych negative neurological ROS     GI/Hepatic Neg liver ROS,   Endo/Other  negative endocrine ROS  Renal/GU negative Renal ROS     Musculoskeletal   Abdominal   Peds  Hematology negative hematology ROS (+)   Anesthesia Other Findings   Reproductive/Obstetrics                             Anesthesia Physical Anesthesia Plan  ASA: II  Anesthesia Plan: Epidural   Post-op Pain Management:    Induction:   PONV Risk Score and Plan:   Airway Management Planned: Natural Airway  Additional Equipment:   Intra-op Plan:   Post-operative Plan:   Informed Consent: I have reviewed the patients History and Physical, chart, labs and discussed the procedure including the risks, benefits and alternatives for the proposed anesthesia with the patient or authorized representative who has indicated his/her understanding and acceptance.     Plan Discussed with:   Anesthesia Plan Comments: (Lab Results      Component                Value               Date                      WBC                      8.0                 07/01/2017                HGB                      11.1 (L)            07/01/2017                HCT                      34.6 (L)            07/01/2017                MCV                      83.8                07/01/2017                PLT                      201                 07/01/2017           )        Anesthesia Quick Evaluation

## 2017-07-03 LAB — CBC
HCT: 34.2 % — ABNORMAL LOW (ref 36.0–46.0)
HEMOGLOBIN: 11 g/dL — AB (ref 12.0–15.0)
MCH: 27.1 pg (ref 26.0–34.0)
MCHC: 32.2 g/dL (ref 30.0–36.0)
MCV: 84.2 fL (ref 78.0–100.0)
Platelets: 201 10*3/uL (ref 150–400)
RBC: 4.06 MIL/uL (ref 3.87–5.11)
RDW: 14 % (ref 11.5–15.5)
WBC: 10.6 10*3/uL — ABNORMAL HIGH (ref 4.0–10.5)

## 2017-07-03 LAB — RUBELLA SCREEN: Rubella: 2.9 index (ref >0.99)

## 2017-07-03 LAB — RPR: RPR Ser Ql: NONREACTIVE

## 2017-07-03 NOTE — Discharge Summary (Signed)
Obstetric Discharge Summary Reason for Admission: induction of labor and IUFD Prenatal Procedures: ultrasound Intrapartum Procedures: spontaneous vaginal delivery Postpartum Procedures: none Complications-Operative and Postpartum: none Hemoglobin  Date Value Ref Range Status  07/03/2017 11.0 (L) 12.0 - 15.0 g/dL Final   HCT  Date Value Ref Range Status  07/03/2017 34.2 (L) 36.0 - 46.0 % Final    Physical Exam:  General: alert, cooperative, appears stated age and no distress Lochia: appropriate Uterine Fundus: firm Incision: healing well DVT Evaluation: No evidence of DVT seen on physical exam.  Discharge Diagnoses: IUFD delivered  Discharge Information: Date: 07/03/2017 Activity: pelvic rest Diet: routine Medications: None Condition: stable Instructions: refer to practice specific booklet Discharge to: home to follow up in office 2-3 weeks   Newborn Data: IUFD female  Birth Weight: 1 lb 6.8 oz (645 g) APGAR: 0, 0  Newborn Delivery   Birth date/time:  07/02/2017 13:47:00 Delivery type:  Vaginal, Spontaneous     Home with Mother declined autopsy.  Burial instead.  Reign Dziuba C 07/03/2017, 10:04 AM

## 2017-07-03 NOTE — Anesthesia Postprocedure Evaluation (Signed)
Anesthesia Post Note  Patient: Kayla Mckay  Procedure(s) Performed: AN AD HOC LABOR EPIDURAL     Patient location during evaluation: Mother Baby Anesthesia Type: Epidural Level of consciousness: awake and alert and oriented Pain management: satisfactory to patient Vital Signs Assessment: post-procedure vital signs reviewed and stable Respiratory status: respiratory function stable Cardiovascular status: stable Postop Assessment: no headache, no backache, epidural receding, patient able to bend at knees, no signs of nausea or vomiting and adequate PO intake Anesthetic complications: no    Last Vitals:  Vitals:   07/03/17 0450 07/03/17 0746  BP: (!) 128/96 102/64  Pulse: 73 76  Resp: 17 16  Temp: 36.7 C 36.8 C  SpO2: 100% 100%    Last Pain:  Vitals:   07/03/17 0840  TempSrc:   PainSc: 0-No pain   Pain Goal:                 [place]

## 2017-07-04 NOTE — Progress Notes (Signed)
Post Partum Day 2 Subjective: no complaints, up ad lib, voiding, tolerating PO and Had decided to stay until today.  Greiving appropriately  Objective: Blood pressure 98/66, pulse 75, temperature 98 F (36.7 Mckay), temperature source Oral, resp. rate 18, height 5\' 4"  (1.626 m), weight 210 lb (95.3 kg), last menstrual period 01/10/2017, SpO2 97 %.  Physical Exam:  General: alert, cooperative, appears stated age and no distress Lochia: appropriate Uterine Fundus: firm Incision: healing well DVT Evaluation: No evidence of DVT seen on physical exam.  Recent Labs    07/01/17 2105 07/03/17 0422  HGB 11.1* 11.0*  HCT 34.6* 34.2*    Assessment/Plan: Discharge home  Pt reminded to call for questions or problems and she has a lot of family support   LOS: 3 days   Kayla Mckay 07/04/2017, 9:29 AM

## 2017-07-04 NOTE — Progress Notes (Signed)
Discharge instructions given to pt. Discussed post-vaginal delivery care, signs and symptoms to report to the MD, upcoming appointments, and meds. Pt has no questions or concerns at this time. Funeral arrangements have been made for baby.

## 2017-07-05 LAB — CMV ANTIBODY, IGG (EIA): CMV Ab - IgG: <0.6 U/mL (ref 0.00–0.59)

## 2017-07-05 LAB — RUBELLA SCREEN: RUBELLA: 2.9 {index} (ref >0.99)

## 2017-07-05 LAB — HSV 2 ANTIBODY, IGG: HSV 2 Glycoprotein G Ab, IgG: <0.91 index (ref 0.00–0.90)

## 2017-07-05 LAB — TOXOPLASMA GONDII ANTIBODY, IGG: Toxoplasma IgG Ratio: <3 IU/mL (ref 0.0–7.1)

## 2017-07-05 LAB — HSV 1 ANTIBODY, IGG: HSV 1 GLYCOPROTEIN G AB, IGG: 31.5 {index} — AB (ref 0.00–0.90)

## 2017-07-06 LAB — PARVOVIRUS B19 ANTIBODY, IGG AND IGM
Parovirus B19 IgG Abs: 7.4 index — ABNORMAL HIGH (ref 0.0–0.8)
Parovirus B19 IgM Abs: 0.3 index (ref 0.0–0.8)

## 2017-10-21 ENCOUNTER — Telehealth: Payer: Self-pay

## 2017-10-21 NOTE — Telephone Encounter (Signed)
Referral received from [facility] . I spoke with Nurse Practitioner who clarified reason for referral. 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  History of still born birth 07/2017 with unknown causes. Consult requested for advise related to abnormal HIV testing .   Patient informed of appointment date and time.  Laurell Josephsammy K King, RN

## 2017-10-26 ENCOUNTER — Ambulatory Visit: Payer: 59 | Admitting: Internal Medicine

## 2017-10-27 ENCOUNTER — Ambulatory Visit (INDEPENDENT_AMBULATORY_CARE_PROVIDER_SITE_OTHER): Payer: 59 | Admitting: Internal Medicine

## 2017-10-27 ENCOUNTER — Encounter: Payer: Self-pay | Admitting: Internal Medicine

## 2017-10-27 DIAGNOSIS — Z114 Encounter for screening for human immunodeficiency virus [HIV]: Secondary | ICD-10-CM

## 2017-10-27 NOTE — Progress Notes (Signed)
    [facility]      Reason for Consult: Recent testing for HIV    Referring Physician: [facility]    Patient ID: MAGDALYN ARENIVAS, female    DOB: Apr 17, 1995, 22 y.o.   MRN: [id number]  HPI:   She has here for evaluation of a recent testing for HIV.  She has a history of testing of HIV and was negative and previously has had a false positive screen that was confirmed negative and comes in again with a screen positive confirmation negative test.  She recently had a partner initially using condoms and then later did not and did develop chlamydia infection and she did undergo complete evaluation by her gynecologist and that included the HIV test.  She otherwise has no significant risk factors, no recent symptoms of fever or lymph swelling.  Her HIV test was done about 2 weeks ago.  Past Medical History:  Diagnosis Date  . Acne   . Atopic dermatitis   . PCOS (polycystic ovarian syndrome)   . Polycystic ovary syndrome     Prior to Admission medications   Medication Sig Start Date End Date Taking? Authorizing Provider  Prenatal Vit-Fe Fumarate-FA (PRENATAL MULTIVITAMIN) TABS tablet Take 1 tablet by mouth daily at 12 noon.    [provider]    No Known Allergies  Social History   Tobacco Use  . Smoking status: Never Smoker  . Smokeless tobacco: Never Used  Substance Use Topics  . Alcohol use: Yes    Comment: socially  . Drug use: No    Family History  Problem Relation Age of Onset  . Cancer Maternal Grandmother   . Cancer Paternal Grandfather     Review of Systems  Constitutional: negative for fevers and chills Gastrointestinal: negative for nausea All other systems reviewed and are negative    Constitutional: in no apparent distress and alert  Vitals:   10/27/17 0917  BP: 117/82  Pulse: 62  Temp: 99 F (37.2 C)   EYES: anicteric ENMT: no thrush Cardiovascular: Cor RRR Respiratory: CTA B; normal respiratory  effort Musculoskeletal: no pedal edema noted Skin: negatives: no rash Hematologic: no cervical lad Neuro: non-focal  Labs: Lab Results  Component Value Date   WBC 10.6 (H) 07/03/2017   HGB 11.0 (L) 07/03/2017   HCT 34.2 (L) 07/03/2017   MCV 84.2 07/03/2017   PLT 201 07/03/2017    Lab Results  Component Value Date   CREATININE 0.87 05/31/2014   BUN 7 05/31/2014   NA 140 05/31/2014   K 3.3 (L) 05/31/2014   CL 108 05/31/2014   CO2 25 05/31/2014    Lab Results  Component Value Date   ALT 15 05/31/2014   AST 26 05/31/2014   ALKPHOS 70 05/31/2014   BILITOT 0.8 05/31/2014     Assessment: False positive test and I discussed the nature of screening versus confirmation.  She voiced her understanding.  No further questions.  Plan: 1) return as needed

## 2018-06-07 IMAGING — US US OB TRANSVAGINAL
1 series · 15 of 28 positions shown · non-contrast
Comparison: None.

CLINICAL DATA: Vaginal bleeding

EXAM:
OBSTETRIC <14 WK US AND TRANSVAGINAL OB US
TECHNIQUE: Both transabdominal and transvaginal ultrasound examinations were
performed for complete evaluation of the gestation as well as the
maternal uterus, adnexal regions, and pelvic cul-de-sac.
Transvaginal technique was performed to assess early pregnancy.

[Series 1: us ob transvaginal · 15 of 58 slices shown]
[im 1/58]
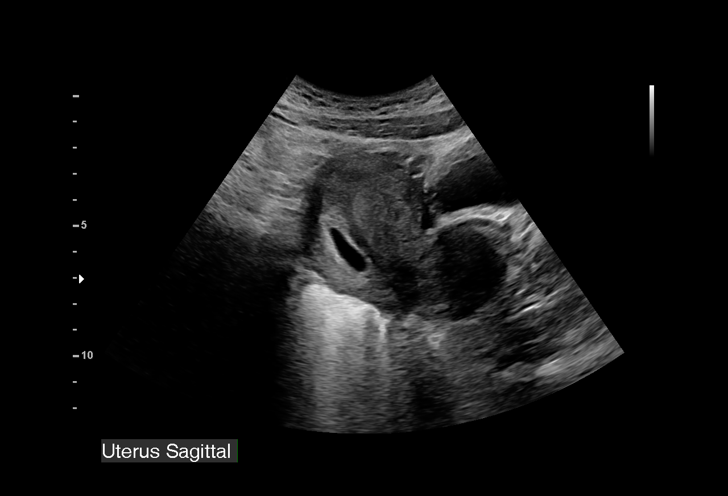
[im 5/58]
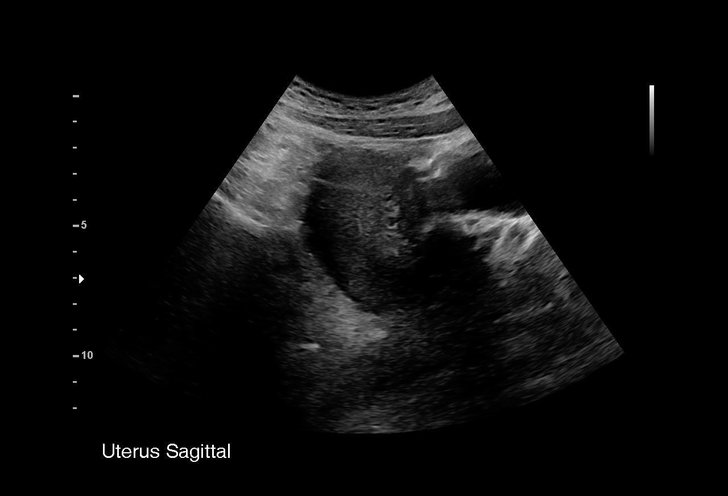
[im 9/58]
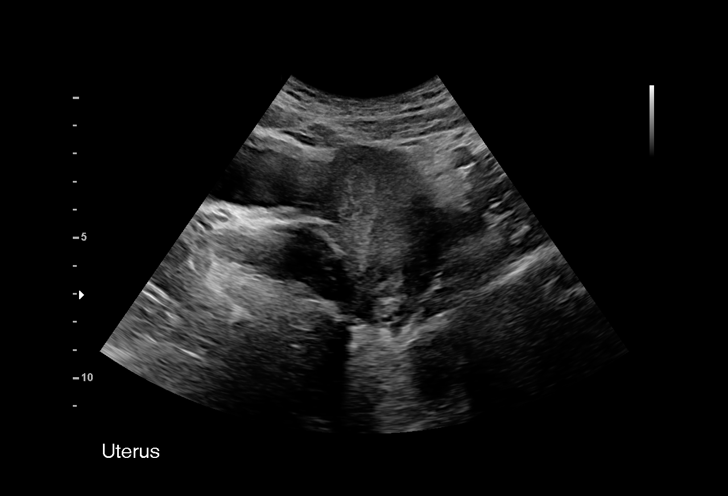
[im 13/58]
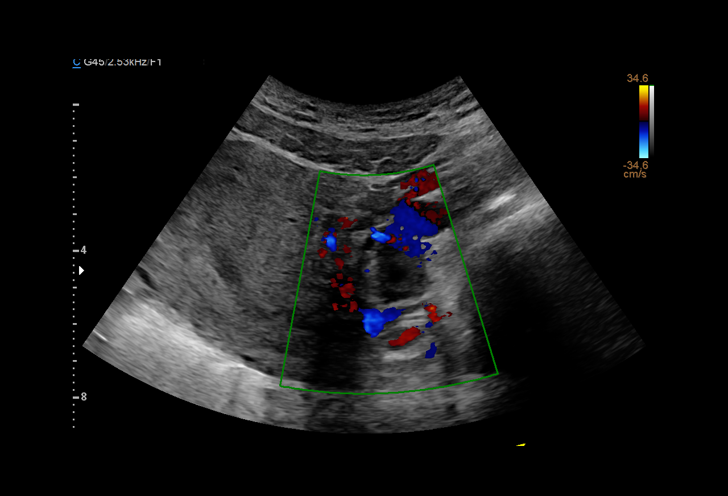
[im 17/58]
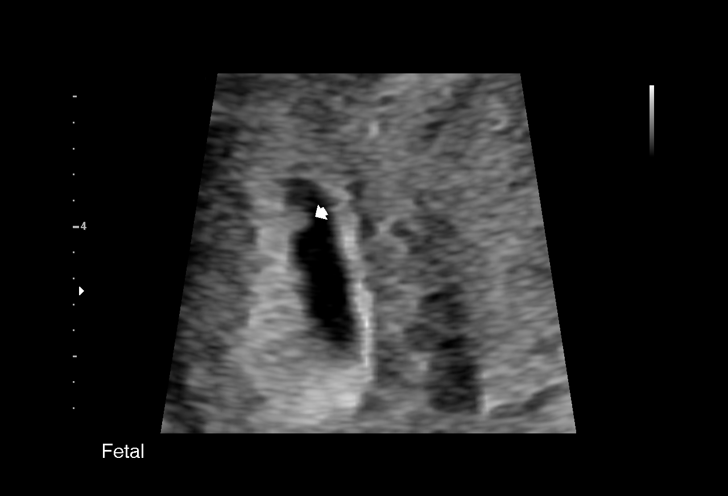
[im 22/58]
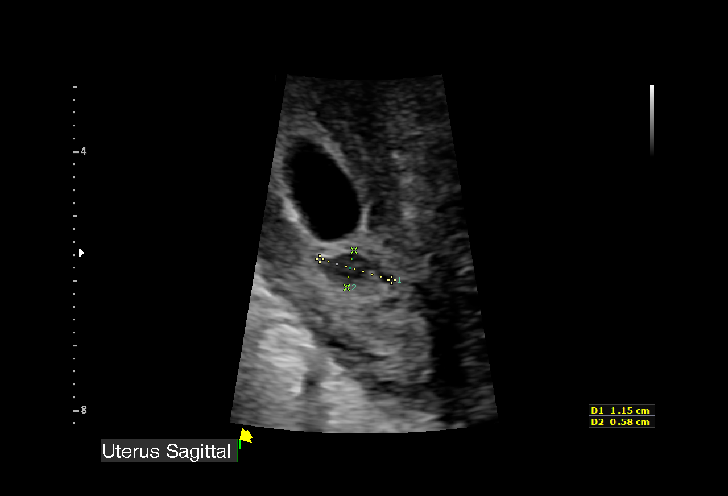
[im 26/58]
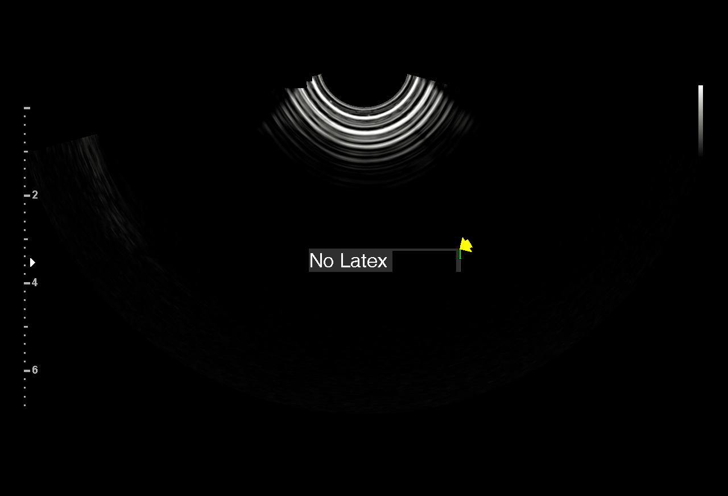
[im 30/58]
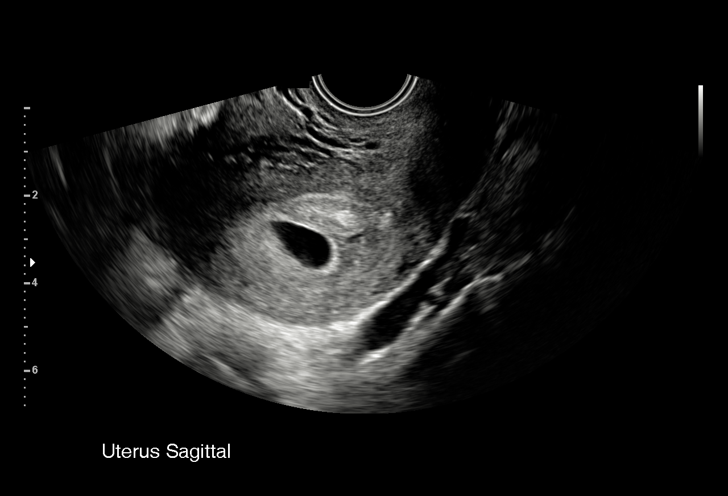
[im 32/58]
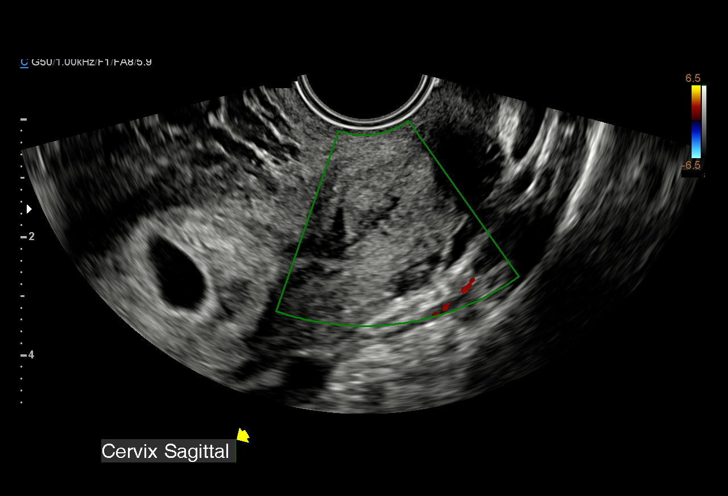
[im 36/58]
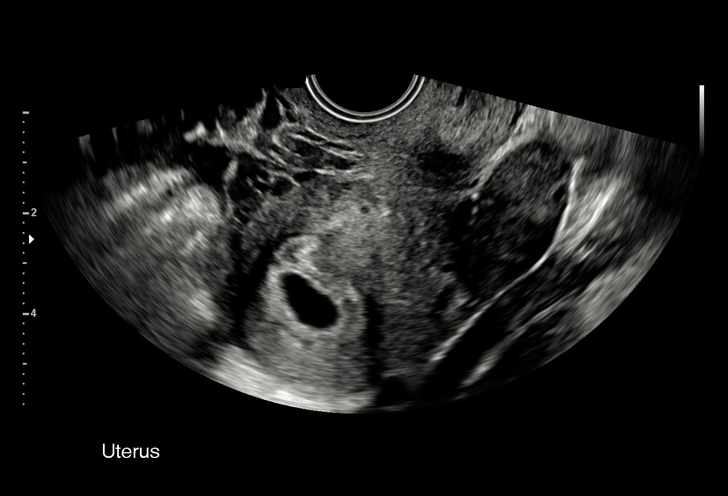
[im 41/58]
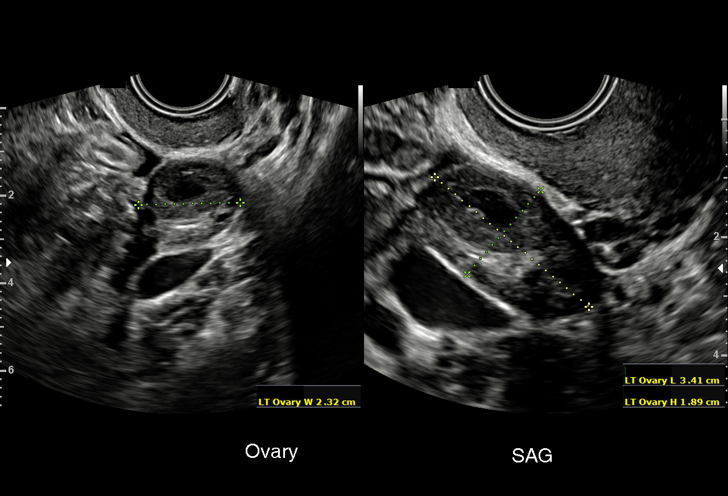
[im 45/58]
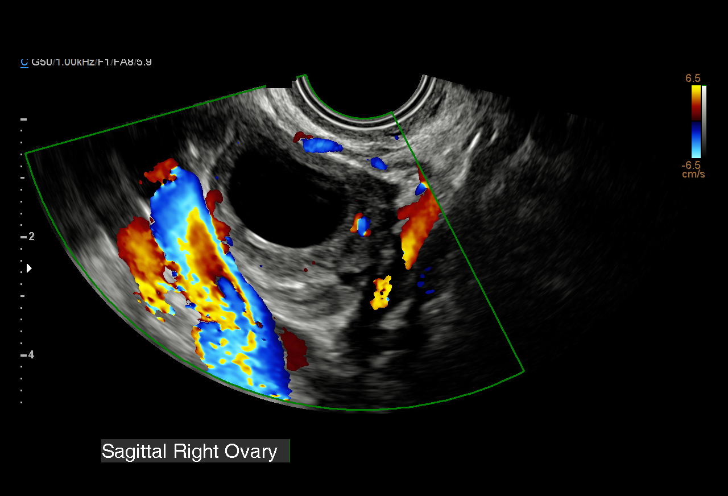
[im 49/58]
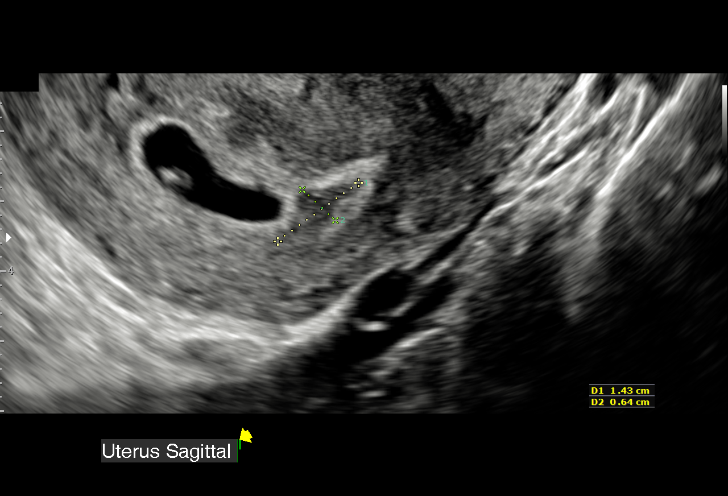
[im 53/58]
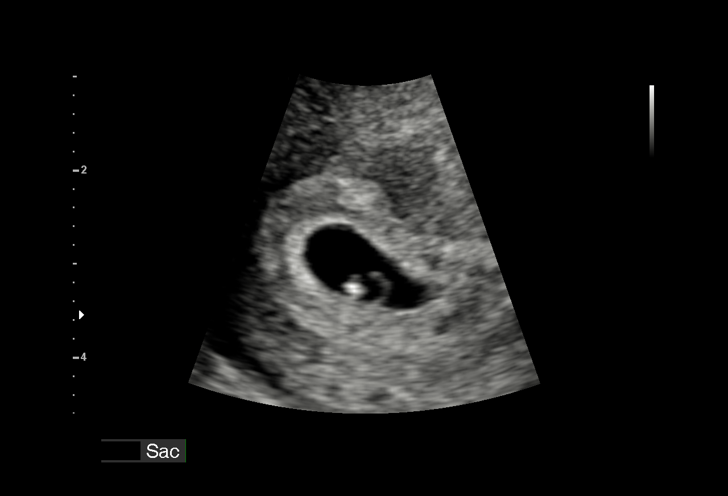
[im 58/58]
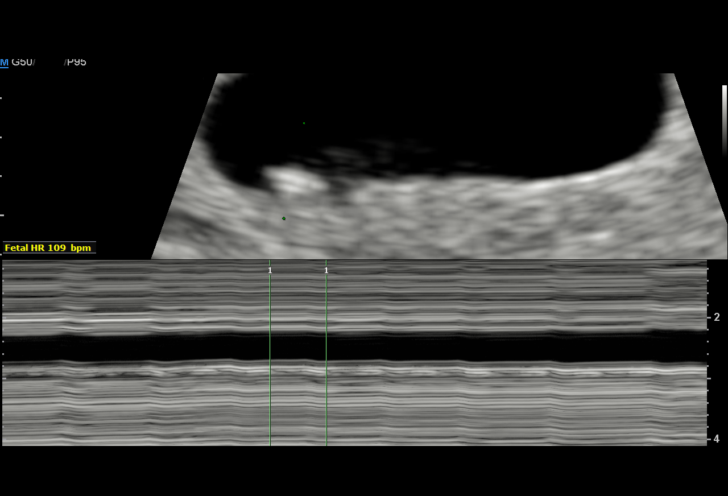

[15 of 28 positions shown; findings below may reference images not displayed]

FINDINGS: Intrauterine gestational sac: Single

Yolk sac:  Visualized

Embryo:  Visualized

Cardiac Activity: Visualized

Heart Rate: 110 bpm

MSD:   mm    w     d

CRL:  4.5 mm   6 w   1 d                  US EDC: 10/19/2017

Subchorionic hemorrhage:  Small subchorionic hemorrhage

Maternal uterus/adnexae: Small complex cyst in the right ovary
measures 2.7 cm, likely hemorrhagic cyst. Left corpus luteal cyst.
No free fluid.
IMPRESSION: Six week 1 day intrauterine pregnancy. Fetal heart rate 110 beats
per minute. Small subchorionic hemorrhage.
